# Patient Record
Sex: Female | Born: 1965 | Race: White | Hispanic: No | Smoking: Never smoker
Health system: Southern US, Community
[De-identification: ages and names within clinical notes are randomized; demographics above are authoritative.]

## PROBLEM LIST (undated history)

## (undated) DIAGNOSIS — I10 Essential (primary) hypertension: Secondary | ICD-10-CM

---

## 2016-08-27 ENCOUNTER — Inpatient Hospital Stay (HOSPITAL_COMMUNITY)
Admission: EM | Admit: 2016-08-27 | Discharge: 2016-08-30 | DRG: 872 | Disposition: A | Discharge status: Home / Self Care | Payer: Managed Care, Other (non HMO) | Attending: Family Medicine | Admitting: Family Medicine

## 2016-08-27 ENCOUNTER — Emergency Department (HOSPITAL_COMMUNITY): Payer: Managed Care, Other (non HMO)

## 2016-08-27 ENCOUNTER — Encounter (HOSPITAL_COMMUNITY): Payer: Self-pay

## 2016-08-27 DIAGNOSIS — N730 Acute parametritis and pelvic cellulitis: Secondary | ICD-10-CM | POA: Diagnosis present

## 2016-08-27 DIAGNOSIS — A419 Sepsis, unspecified organism: Secondary | ICD-10-CM | POA: Diagnosis present

## 2016-08-27 DIAGNOSIS — I10 Essential (primary) hypertension: Secondary | ICD-10-CM | POA: Diagnosis present

## 2016-08-27 DIAGNOSIS — D259 Leiomyoma of uterus, unspecified: Secondary | ICD-10-CM | POA: Diagnosis present

## 2016-08-27 DIAGNOSIS — D509 Iron deficiency anemia, unspecified: Secondary | ICD-10-CM | POA: Diagnosis present

## 2016-08-27 DIAGNOSIS — E872 Acidosis: Secondary | ICD-10-CM | POA: Diagnosis present

## 2016-08-27 DIAGNOSIS — R42 Dizziness and giddiness: Secondary | ICD-10-CM | POA: Diagnosis present

## 2016-08-27 DIAGNOSIS — Z9851 Tubal ligation status: Secondary | ICD-10-CM | POA: Diagnosis not present

## 2016-08-27 DIAGNOSIS — Z6841 Body Mass Index (BMI) 40.0 and over, adult: Secondary | ICD-10-CM | POA: Diagnosis not present

## 2016-08-27 DIAGNOSIS — M549 Dorsalgia, unspecified: Secondary | ICD-10-CM | POA: Diagnosis present

## 2016-08-27 DIAGNOSIS — N939 Abnormal uterine and vaginal bleeding, unspecified: Secondary | ICD-10-CM | POA: Diagnosis present

## 2016-08-27 HISTORY — DX: Essential (primary) hypertension: I10

## 2016-08-27 LAB — CBC WITH DIFFERENTIAL/PLATELET
BASOS ABS: 0 10*3/uL (ref 0.0–0.1)
BASOS PCT: 0 %
Basophils Absolute: 0 10*3/uL (ref 0.0–0.1)
Basophils Relative: 0 %
EOS ABS: 0 10*3/uL (ref 0.0–0.7)
EOS PCT: 0 %
EOS PCT: 0 %
Eosinophils Absolute: 0 10*3/uL (ref 0.0–0.7)
HEMATOCRIT: 30.1 % — AB (ref 36.0–46.0)
HEMATOCRIT: 27 % — AB (ref 36.0–46.0)
HEMOGLOBIN: 8.7 g/dL — AB (ref 12.0–15.0)
Hemoglobin: 7.6 g/dL — ABNORMAL LOW (ref 12.0–15.0)
LYMPHS PCT: 7 %
Lymphocytes Relative: 4 %
Lymphs Abs: 0.8 10*3/uL (ref 0.7–4.0)
Lymphs Abs: 0.7 10*3/uL (ref 0.7–4.0)
MCH: 18.5 pg — ABNORMAL LOW (ref 26.0–34.0)
MCH: 18 pg — AB (ref 26.0–34.0)
MCHC: 28.9 g/dL — ABNORMAL LOW (ref 30.0–36.0)
MCHC: 28.1 g/dL — ABNORMAL LOW (ref 30.0–36.0)
MCV: 64 fL — AB (ref 78.0–100.0)
MCV: 64 fL — AB (ref 78.0–100.0)
MONO ABS: 0.9 10*3/uL (ref 0.1–1.0)
MONOS PCT: 3 %
Monocytes Absolute: 0.3 10*3/uL (ref 0.1–1.0)
Monocytes Relative: 5 %
NEUTROS ABS: 16.9 10*3/uL — AB (ref 1.7–7.7)
NEUTROS PCT: 90 %
NEUTROS PCT: 91 %
Neutro Abs: 9.8 10*3/uL — ABNORMAL HIGH (ref 1.7–7.7)
PLATELETS: 234 10*3/uL (ref 150–400)
Platelets: 257 10*3/uL (ref 150–400)
RBC: 4.7 MIL/uL (ref 3.87–5.11)
RBC: 4.22 MIL/uL (ref 3.87–5.11)
RDW: 18.1 % — ABNORMAL HIGH (ref 11.5–15.5)
RDW: 18.3 % — AB (ref 11.5–15.5)
WBC: 10.9 10*3/uL — AB (ref 4.0–10.5)
WBC: 18.5 10*3/uL — ABNORMAL HIGH (ref 4.0–10.5)

## 2016-08-27 LAB — I-STAT CG4 LACTIC ACID, ED
Lactic Acid, Venous: 2.31 mmol/L (ref 0.5–1.9)
Lactic Acid, Venous: 1.74 mmol/L (ref 0.5–1.9)

## 2016-08-27 LAB — COMPREHENSIVE METABOLIC PANEL
ALBUMIN: 3.4 g/dL — AB (ref 3.5–5.0)
ALT: 21 U/L (ref 14–54)
AST: 18 U/L (ref 15–41)
Alkaline Phosphatase: 65 U/L (ref 38–126)
Anion gap: 7 (ref 5–15)
BUN: 10 mg/dL (ref 6–20)
CHLORIDE: 107 mmol/L (ref 101–111)
CO2: 25 mmol/L (ref 22–32)
CREATININE: 0.66 mg/dL (ref 0.44–1.00)
Calcium: 9 mg/dL (ref 8.9–10.3)
GFR calc Af Amer: >60 mL/min (ref >60)
GLUCOSE: 152 mg/dL — AB (ref 65–99)
Potassium: 3.5 mmol/L (ref 3.5–5.1)
Sodium: 139 mmol/L (ref 135–145)
Total Bilirubin: 0.4 mg/dL (ref 0.3–1.2)
Total Protein: 6.9 g/dL (ref 6.5–8.1)

## 2016-08-27 LAB — URINALYSIS, ROUTINE W REFLEX MICROSCOPIC
Bilirubin Urine: NEGATIVE
GLUCOSE, UA: NEGATIVE mg/dL
KETONES UR: NEGATIVE mg/dL
LEUKOCYTES UA: NEGATIVE
NITRITE: NEGATIVE
PH: 5.5 (ref 5.0–8.0)
Protein, ur: NEGATIVE mg/dL
Specific Gravity, Urine: 1.03 (ref 1.005–1.030)

## 2016-08-27 LAB — URINE MICROSCOPIC-ADD ON: WBC, UA: NONE SEEN WBC/hpf (ref 0–5)

## 2016-08-27 LAB — I-STAT BETA HCG BLOOD, ED (MC, WL, AP ONLY)

## 2016-08-27 LAB — ABO/RH: ABO/RH(D): A POS

## 2016-08-27 LAB — WET PREP, GENITAL
CLUE CELLS WET PREP: NONE SEEN
Sperm: NONE SEEN
TRICH WET PREP: NONE SEEN
Yeast Wet Prep HPF POC: NONE SEEN

## 2016-08-27 MED ORDER — DOXYCYCLINE HYCLATE 100 MG IV SOLR
100.0000 mg | Freq: Two times a day (BID) | INTRAVENOUS | Status: DC
  Administered 2016-08-28 – 2016-08-30 (×5): 100 mg via INTRAVENOUS
  Filled 2016-08-27 (×6): qty 100

## 2016-08-27 MED ORDER — ONDANSETRON HCL 4 MG/2ML IJ SOLN
4.0000 mg | Freq: Once | INTRAMUSCULAR | Status: AC
  Administered 2016-08-27: 4 mg via INTRAVENOUS
  Filled 2016-08-27: qty 2

## 2016-08-27 MED ORDER — MEGESTROL ACETATE 40 MG PO TABS
80.0000 mg | ORAL_TABLET | Freq: Two times a day (BID) | ORAL | Status: DC
  Administered 2016-08-28 – 2016-08-30 (×5): 80 mg via ORAL
  Filled 2016-08-27 (×5): qty 2

## 2016-08-27 MED ORDER — DEXTROSE 5 % IV SOLN
2.0000 g | Freq: Four times a day (QID) | INTRAVENOUS | Status: DC
  Administered 2016-08-28 (×3): 2 g via INTRAVENOUS
  Filled 2016-08-27 (×7): qty 2

## 2016-08-27 MED ORDER — SODIUM CHLORIDE 0.9 % IV SOLN
INTRAVENOUS | Status: AC
  Administered 2016-08-28: via INTRAVENOUS

## 2016-08-27 MED ORDER — DEXTROSE 5 % IV SOLN
2.0000 g | Freq: Once | INTRAVENOUS | Status: AC
  Administered 2016-08-27: 2 g via INTRAVENOUS
  Filled 2016-08-27: qty 2

## 2016-08-27 MED ORDER — SODIUM CHLORIDE 0.9 % IV SOLN
Freq: Once | INTRAVENOUS | Status: AC
  Administered 2016-08-28: via INTRAVENOUS

## 2016-08-27 MED ORDER — ACETAMINOPHEN 500 MG PO TABS
1000.0000 mg | ORAL_TABLET | Freq: Four times a day (QID) | ORAL | Status: DC | PRN
  Administered 2016-08-28: 1000 mg via ORAL
  Filled 2016-08-27: qty 2

## 2016-08-27 MED ORDER — HYDROMORPHONE HCL 1 MG/ML IJ SOLN
1.0000 mg | Freq: Once | INTRAMUSCULAR | Status: AC
  Administered 2016-08-27: 1 mg via INTRAVENOUS
  Filled 2016-08-27: qty 1

## 2016-08-27 MED ORDER — ONDANSETRON HCL 4 MG/2ML IJ SOLN
4.0000 mg | Freq: Four times a day (QID) | INTRAMUSCULAR | Status: DC | PRN
  Administered 2016-08-28: 4 mg via INTRAVENOUS
  Filled 2016-08-27: qty 2

## 2016-08-27 MED ORDER — SODIUM CHLORIDE 0.9 % IV BOLUS (SEPSIS)
1000.0000 mL | Freq: Once | INTRAVENOUS | Status: AC
  Administered 2016-08-27: 1000 mL via INTRAVENOUS

## 2016-08-27 MED ORDER — MEGESTROL ACETATE 40 MG PO TABS
80.0000 mg | ORAL_TABLET | Freq: Once | ORAL | Status: AC
  Administered 2016-08-27: 80 mg via ORAL
  Filled 2016-08-27: qty 2

## 2016-08-27 MED ORDER — IOPAMIDOL (ISOVUE-300) INJECTION 61%
INTRAVENOUS | Status: AC
  Administered 2016-08-27: 100 mL
  Filled 2016-08-27: qty 100

## 2016-08-27 MED ORDER — HYDROCODONE-ACETAMINOPHEN 5-325 MG PO TABS
1.0000 | ORAL_TABLET | ORAL | Status: DC | PRN
  Administered 2016-08-28: 2 via ORAL
  Administered 2016-08-28: 1 via ORAL
  Administered 2016-08-28 – 2016-08-30 (×5): 2 via ORAL
  Filled 2016-08-27 (×2): qty 2
  Filled 2016-08-27: qty 1
  Filled 2016-08-27 (×4): qty 2

## 2016-08-27 MED ORDER — ACETAMINOPHEN 325 MG PO TABS
650.0000 mg | ORAL_TABLET | Freq: Once | ORAL | Status: AC
  Administered 2016-08-27: 650 mg via ORAL

## 2016-08-27 MED ORDER — ACETAMINOPHEN 325 MG PO TABS
ORAL_TABLET | ORAL | Status: AC
  Filled 2016-08-27: qty 2

## 2016-08-27 MED ORDER — ONDANSETRON HCL 4 MG/2ML IJ SOLN: 4.0000 mg | Freq: Once | INTRAMUSCULAR | Status: DC

## 2016-08-27 MED ORDER — DOXYCYCLINE HYCLATE 100 MG IV SOLR
100.0000 mg | Freq: Once | INTRAVENOUS | Status: AC
  Administered 2016-08-28: 100 mg via INTRAVENOUS
  Filled 2016-08-27: qty 100

## 2016-08-27 NOTE — ED Provider Notes (Signed)
[facility] DEPT Provider Note   CSN: [id number] Arrival date & time: 08/27/16  1527   History   Chief Complaint Chief Complaint  Patient presents with  . Vaginal Bleeding  . Fever    HPI Sharon Manning is a 50 y.o. female.  The history is provided by the patient and a relative.   50 year old female with history of hypertension presenting with abdominal pain and vaginal bleeding. Onset about 5 days ago. Worsening since then. Now severe. Vaginal bleeding described as thick clots, now turning bright red. She states she has been soaking through a large tampon or pad at a rate of about 1 an hour. She became concerned because today she started feeling lightheaded and nauseous with standing. States that her last several periods have been more closely spaced, occurring every month, with heavy bleeding and passage of clot associated with this. Previously she had started having cycles every 3-4 months. She does not have a known history of fibroids, but is concerned because she has a strong family history of fibroids. Has not seen an OB/GYN in over 8 years. Prior to starting this menstrual cycle she has not had vaginal pain or discharge and has no concerns for STIs. Has hx of vaginal deliveries and tubal ligation.   Abdominal pain located in bilateral pelvic area, right-sided more than left. Radiating around right flank. Described as pressure and sore. Worse with standing or palpation. Severe. Worsening since onset, and today the pain was worse than prior days. Associated fever, nausea. No urinary symptoms, and patient denies diarrhea or constipation. Last bowel movement today was normal.   Past Medical History:  Diagnosis Date  . Hypertension     Patient Active Problem List   Diagnosis Date Noted  . PID (acute pelvic inflammatory disease) 08/27/2016  . Sepsis, unspecified organism ([place]) 08/27/2016  . Abnormal uterine bleeding (AUB) 08/27/2016  . Microcytic anemia 08/27/2016  . Sepsis  ([place]) 08/27/2016    History reviewed. No pertinent surgical history.  OB History    No data available       Home Medications    Prior to Admission medications   Medication Sig Start Date End Date Taking? Authorizing Provider  ibuprofen (ADVIL) 200 MG tablet Take 800 mg by mouth 2 (two) times daily as needed for moderate pain.   Yes Historical Provider, MD  OVER THE COUNTER MEDICATION Take 1 tablet by mouth at bedtime as needed (for restless leg syndrome). Legatrin   Yes Historical Provider, MD    Family History Family History  Problem Relation Age of Onset  . Uterine cancer Neg Hx     Social History Social History  Substance Use Topics  . Smoking status: Never Smoker  . Smokeless tobacco: Never Used  . Alcohol use Not on file     Allergies   Review of patient's allergies indicates no known allergies.   Review of Systems Review of Systems  Constitutional: Positive for fatigue and fever.  Respiratory: Negative for cough and shortness of breath.   Cardiovascular: Negative for chest pain.  Gastrointestinal: Positive for abdominal pain and nausea. Negative for blood in stool, constipation, diarrhea and vomiting.  Genitourinary: Positive for menstrual problem, pelvic pain and vaginal bleeding. Negative for dysuria and hematuria.  Skin: Positive for pallor.  Neurological: Positive for light-headedness.  Hematological: Does not bruise/bleed easily.  All other systems reviewed and are negative.   Physical Exam Updated Vital Signs BP 121/75   Pulse 89   Temp (S) 101.2 F (38.4 C) (  Oral)   Resp 17   LMP 08/22/2016   SpO2 99%   Physical Exam  Constitutional: She appears well-developed and well-nourished. No distress.  HENT:  Head: Normocephalic and atraumatic.  Eyes: Conjunctivae are normal.  Neck: Neck supple.  Cardiovascular: Normal rate and regular rhythm.   No murmur heard. Pulmonary/Chest: Effort normal and breath sounds normal. No respiratory distress.    Abdominal: Soft. There is tenderness (TTP RLQ, suprapubic area, as well as in epigastric, no guarding or rebound).  Genitourinary:  Genitourinary Comments: CMT. Enlarged, tender uterus. bilat adnexal tenderness. Bloody mucous trickling from os. External os with adherent irregular shaped, nontender mass.   Musculoskeletal: She exhibits no edema.  Neurological: She is alert.  Skin: Skin is warm and dry. There is pallor.  Psychiatric: She has a normal mood and affect.  Nursing note and vitals reviewed.   ED Treatments / Results  Labs (all labs ordered are listed, but only abnormal results are displayed) Labs Reviewed  WET PREP, GENITAL - Abnormal; Notable for the following:       Result Value   WBC, Wet Prep HPF POC MANY (*)    All other components within normal limits  COMPREHENSIVE METABOLIC PANEL - Abnormal; Notable for the following:    Glucose, Bld 152 (*)    Albumin 3.4 (*)    All other components within normal limits  CBC WITH DIFFERENTIAL/PLATELET - Abnormal; Notable for the following:    WBC 10.9 (*)    Hemoglobin 8.7 (*)    HCT 30.1 (*)    MCV 64.0 (*)    MCH 18.5 (*)    MCHC 28.9 (*)    RDW 18.1 (*)    Neutro Abs 9.8 (*)    All other components within normal limits  URINALYSIS, ROUTINE W REFLEX MICROSCOPIC (NOT AT [facility]) - Abnormal; Notable for the following:    Hgb urine dipstick MODERATE (*)    All other components within normal limits  URINE MICROSCOPIC-ADD ON - Abnormal; Notable for the following:    Squamous Epithelial / LPF 0-5 (*)    Bacteria, UA RARE (*)    All other components within normal limits  CBC WITH DIFFERENTIAL/PLATELET - Abnormal; Notable for the following:    WBC 18.5 (*)    Hemoglobin 7.6 (*)    HCT 27.0 (*)    MCV 64.0 (*)    MCH 18.0 (*)    MCHC 28.1 (*)    RDW 18.3 (*)    Neutro Abs 16.9 (*)    All other components within normal limits  I-STAT CG4 LACTIC ACID, ED - Abnormal; Notable for the following:    Lactic Acid, Venous 2.31 (*)     All other components within normal limits  CULTURE, BLOOD (ROUTINE X 2)  CULTURE, BLOOD (ROUTINE X 2)  URINE CULTURE  LACTIC ACID, PLASMA  LACTIC ACID, PLASMA  PROCALCITONIN  PROTIME-INR  APTT  FERRITIN  IRON AND TIBC  VITAMIN B12  FOLATE RBC  HEMOGLOBIN AND HEMATOCRIT, BLOOD  HEMOGLOBIN AND HEMATOCRIT, BLOOD  HEMOGLOBIN AND HEMATOCRIT, BLOOD  I-STAT BETA HCG BLOOD, ED ([facility], [facility], [facility] ONLY)  I-STAT CG4 LACTIC ACID, ED  I-STAT CG4 LACTIC ACID, ED  TYPE AND SCREEN  ABO/RH  PREPARE RBC (CROSSMATCH)  GC/CHLAMYDIA PROBE AMP ([place]) NOT AT [facility]    EKG  EKG Interpretation None       Radiology Dg Chest 2 View  Result Date: 08/27/2016 CLINICAL DATA:  Patient with heavy menstrual bleeding. Dizziness. Syncope. EXAM:  CHEST  2 VIEW COMPARISON:  None. FINDINGS: The heart size and mediastinal contours are within normal limits. Both lungs are clear. The visualized skeletal structures are unremarkable. IMPRESSION: No active cardiopulmonary disease. Electronically Signed   By: Lovey Newcomer M.D.   On: 08/27/2016 16:30   Ct Abdomen Pelvis W Contrast  Result Date: 08/27/2016 CLINICAL DATA:  Abdominal and back pain for 1 week with heavy vaginal bleeding with clots, chills and weakness today, pale arrival, severe abdominal pain primarily RIGHT lower quadrant and suprapubic becoming more generalized now EXAM: CT ABDOMEN AND PELVIS WITH CONTRAST TECHNIQUE: Multidetector CT imaging of the abdomen and pelvis was performed using the standard protocol following bolus administration of intravenous contrast. Sagittal and coronal MPR images reconstructed from axial data set. CONTRAST:  158mL ISOVUE-300 IOPAMIDOL (ISOVUE-300) INJECTION 61% IV. Dilute oral contrast. COMPARISON:  None FINDINGS: Lower chest: Dependent atelectasis at lung bases. Hepatobiliary: Gallbladder surgically absent.  Liver unremarkable. Pancreas: Normal appearance Spleen: Nonspecific low-attenuation focus at superior aspect of spleen  anteriorly 14 x 11 mm image 16. Adrenals/Urinary Tract: Adrenal glands normal appearance. BILATERAL renal cysts. Kidneys, ureters, and bladder otherwise normal appearance. Stomach/Bowel: Normal appendix. Stomach contains food debris, otherwise normal appearance. Scattered stool throughout colon without bowel dilatation. Infiltrative changes identified surrounding small bowel loops in the mid abdomen into the upper RIGHT pelvis though none of the small bowel loos demonstrate wall thickening or dilatation. Vascular/Lymphatic: Vascular structures patent.  No adenopathy. Reproductive: Uterus prominent in size at 11.3 x 6.2 x 8.2 cm. Exophytic leiomyoma posterior RIGHT uterus cross fit 3 cm diameter. Additional tiny low-attenuation focus 11 mm diameter within RIGHT uterus could represent small cyst or additional leiomyoma. BILATERAL fallopian tube enlargement and mild dilatation with wall thickening of the RIGHT tube versus LEFT. Findings are suspicious for BILATERAL salpingitis PID. Small cyst within RIGHT ovary 19 x 17 mm. Free fluid adjacent to uterus as well as in the upper RIGHT pelvis into the mid abdomen with associated mild omental edema. Other: No free air. Tiny umbilical hernia. Question nabothian cysts at lower uterine segment. Musculoskeletal: No acute osseous findings. IMPRESSION: Enlarged uterus with a 3 cm leiomyoma. BILATERAL fallopian tube enlargement and dilatation with wall thickening particularly on RIGHT associated with edema and infiltrative changes in primarily in the RIGHT pelvis, with less edema/fluid adjacent to uterus, raising question of BILATERAL salpingitis/PID; correlation with physical exam for cervical motion tenderness recommended. Small RIGHT ovarian cyst. Small umbilical hernia. Nonspecific 14 x 11 mm lesion within spleen. Electronically Signed   By: Lavonia Dana M.D.   On: 08/27/2016 19:13    Procedures Procedures (including critical care time)  Medications Ordered in  ED Medications  ondansetron (ZOFRAN) injection 4 mg (4 mg Intravenous Refused 08/27/16 2131)  doxycycline (VIBRAMYCIN) 100 mg in dextrose 5 % 250 mL IVPB (100 mg Intravenous New Bag/Given 08/28/16 0005)  cefOXitin (MEFOXIN) 2 g in dextrose 5 % 50 mL IVPB (not administered)  doxycycline (VIBRAMYCIN) 100 mg in dextrose 5 % 250 mL IVPB (not administered)  acetaminophen (TYLENOL) tablet 1,000 mg (not administered)  HYDROcodone-acetaminophen (NORCO/VICODIN) 5-325 MG per tablet 1-2 tablet (1 tablet Oral Given 08/28/16 0003)  ondansetron (ZOFRAN) injection 4 mg (not administered)  megestrol (MEGACE) tablet 80 mg (not administered)  0.9 %  sodium chloride infusion ( Intravenous New Bag/Given 08/28/16 0006)  acetaminophen (TYLENOL) tablet 650 mg (650 mg Oral Given 08/27/16 1547)  sodium chloride 0.9 % bolus 1,000 mL (0 mLs Intravenous Stopped 08/27/16 2005)  HYDROmorphone (DILAUDID) injection 1  mg (1 mg Intravenous Given 08/27/16 1708)  ondansetron (ZOFRAN) injection 4 mg (4 mg Intravenous Given 08/27/16 1707)  iopamidol (ISOVUE-300) 61 % injection (100 mLs  Contrast Given 08/27/16 1833)  HYDROmorphone (DILAUDID) injection 1 mg (1 mg Intravenous Given 08/27/16 1952)  cefOXitin (MEFOXIN) 2 g in dextrose 5 % 50 mL IVPB (0 g Intravenous Stopped 08/28/16 0000)  megestrol (MEGACE) tablet 80 mg (80 mg Oral Given 08/27/16 2358)  0.9 %  sodium chloride infusion ( Intravenous New Bag/Given 08/28/16 0006)     Initial Impression / Assessment and Plan / ED Course  I have reviewed the triage vital signs and the nursing notes.  Pertinent labs & imaging results that were available during my care of the patient were reviewed by me and considered in my medical decision making (see chart for details).  Clinical Course    50 year old female with history of hypertension presenting with abdominal pain and vaginal bleeding, as above. She is febrile and tachycardic, with a lactic acidosis and leukocytosis, concerning for sepsis.  Pelvic exam with significant cervical motion tenderness, cervical mass, enlarged uterus, bilateral adnexal tenderness. She also has significant tenderness on her abdominal exam as above. In setting of this, CT abdomen and pelvis obtained to evaluate further- notable for bilat salpingitis without evidence of TOA, consistent with overall clinical picture of PID. Cultures drawn and patient started on cefoxitin and doxycycline.  Vaginal bleeding likely secondary to fibroid seen on CT scan. Hemoglobin here initially 8.7, on repeat down to 7.6.  Discussed with OB/GYN on call. They will continue to follow in consultation as needed. Advised starting 80 megace BID until bleeding stops, then to discharge patient on 40/day until she is seen in clinic to follow up. First dose of this ordered here.   Admitted to hospitalist for further care.   Case discussed with Dr. Jeneen Rinks who oversaw management of this patient.    Final Clinical Impressions(s) / ED Diagnoses   Final diagnoses:  Vaginal bleeding  Uterine leiomyoma, unspecified location  PID (acute pelvic inflammatory disease)  Sepsis, due to unspecified organism [facility])    New Prescriptions New Prescriptions   No medications on file     Ivin Booty, MD 08/28/16 0104    Tanna Furry, MD 08/30/16 1506

## 2016-08-27 NOTE — ED Notes (Signed)
Pt transported to CT ?

## 2016-08-27 NOTE — ED Notes (Signed)
Awaiting medications from pharmacy.

## 2016-08-27 NOTE — ED Triage Notes (Signed)
Patient complains of abdominal and back pain x 1 week with heavy vaginal bleeding with clots. Today chills and weakness with same. Patient pale on arrival. Alert and oriented. No vomiting, no diarrhea, no cough

## 2016-08-27 NOTE — H&P (Signed)
History and Physical    Sharon Manning [id number] DOB: 12-Aug-1966 DOA: 08/27/2016  PCP: No PCP Per Patient   Patient coming from: Home   Chief Complaint: heavy vaginal bleeding, abdominal and back pain, chills   HPI: Sharon Manning is a 50 y.o. female with medical history significant for hypertension who presents the emergency department for evaluation of 1 week of progressive abdominal pain and heavy vaginal bleeding since 08/22/2016. Patient reports that she continues to have fairly regular periods, and reports that the first 3 days are usually quite heavy. She began bleeding heavily on 08/22/2016, which was the expected time for her period, but the bleeding has persisted since that time and she has required up to 1 pad per hour. Over this same interval, she has had abdominal pain which is now severe and generalized. There is associated pain in the mid to low back as well. She denies dysuria, but endorses chills. She denies any new sexual partners and denies any history of STI. She reports that she has not seen a physician in several years due to lack of insurance. She denies any chest pain, palpitations, headache, change in vision or hearing, or focal numbness or weakness. There is been no diarrhea, melena, or hematochezia. She has not noted any gross hematuria.  ED Course: Upon arrival to the ED, patient is found to be febrile to 39.2 C, saturating well on room air, tachycardic in the 110s, and with vitals otherwise stable. CMP is largely unremarkable and CBC is notable for a leukocytosis to 10,900 and hemoglobin of 8.7 with MCV of 64.0 and unknown baseline hemoglobin. Lactic acid is elevated to a value of 2.31. Urinalysis features moderate hemoglobin is otherwise unremarkable. Chest x-ray is negative for acute cardiopulmonary disease. CT of the abdomen and pelvis was obtained and demonstrates an enlarged uterus with a 3 cm leiomyoma and bilateral fallopian tube enlargement, dilation, wall  thickening, and associated edema and infiltrative changes concerning for bilateral salpingitis/PID. Bimanual pelvic exam was performed by the ED physician and notable for marked cervical motion tenderness. Patient was given a 1 L bolus of normal saline, type and screen was performed, blood and urine cultures were obtained, and she was started on empiric treatment with cefoxitin and doxycycline. ED physician discussed the case with the on-call gynecologist who advised treating with Megace 80 mg twice a day for the bleeding and agreed with antibiotics for PID. Tachycardia resolved following the fluid bolus, the patient has remained hemodynamically stable, and she will be admitted to the telemetry unit for ongoing evaluation and management of sepsis secondary to PID and microcytic anemia with unknown baseline, and abnormal uterine bleeding.  Review of Systems:  All other systems reviewed and apart from HPI, are negative.  Past Medical History:  Diagnosis Date  . Hypertension     History reviewed. No pertinent surgical history.   reports that she has never smoked. She has never used smokeless tobacco. Her alcohol and drug histories are not on file.  No Known Allergies  Family History  Problem Relation Age of Onset  . Uterine cancer Neg Hx      Prior to Admission medications   Medication Sig Start Date End Date Taking? Authorizing Provider  ibuprofen (ADVIL) 200 MG tablet Take 800 mg by mouth 2 (two) times daily as needed for moderate pain.   Yes Historical Provider, MD  OVER THE COUNTER MEDICATION Take 1 tablet by mouth at bedtime as needed (for restless leg syndrome). Legatrin   Yes Historical  Provider, MD    Physical Exam: Vitals:   08/27/16 2130 08/27/16 2145 08/27/16 2200 08/27/16 2215  BP: 114/65 121/66 132/72 130/62  Pulse: 102 (!) 50 100 98  Resp: 17 18 20 16   Temp:      TempSrc:      SpO2: 96% (!) 85% 99% 98%      Constitutional: NAD, calm, comfortable Eyes: PERTLA,  lids, pale conjunctivae ENMT: Mucous membranes are moist. Posterior pharynx clear of any exudate or lesions.   Neck: normal, supple, no masses, no thyromegaly Respiratory: clear to auscultation bilaterally, no wheezing, no crackles. Normal respiratory effort.   Cardiovascular: S1 & S2 heard, regular rate and rhythm, no significant murmur. No extremity edema. 2+ pedal pulses.  Abdomen: No distension, tender throughout, no rebound pain or guarding , no masses palpated. Bowel sounds normal.  Gyn: performed by EDP, see their note for reports of CMT and enlarged uterus Musculoskeletal: no clubbing / cyanosis. No joint deformity upper and lower extremities. Normal muscle tone.  Skin: no significant rashes, lesions, ulcers. Warm, dry, well-perfused. Neurologic: CN 2-12 grossly intact. Sensation intact, DTR normal. Strength 5/5 in all 4 limbs.  Psychiatric: Normal judgment and insight. Alert and oriented x 3. Normal mood and affect.     Labs on Admission: I have personally reviewed following labs and imaging studies  CBC:  Recent Labs Lab 08/27/16 1545 08/27/16 2302  WBC 10.9* 18.5*  NEUTROABS 9.8* PENDING  HGB 8.7* 7.6*  HCT 30.1* 27.0*  MCV 64.0* 64.0*  PLT 257 [id number]   Basic Metabolic Panel:  Recent Labs Lab 08/27/16 1545  NA 139  K 3.5  CL 107  CO2 25  GLUCOSE 152*  BUN 10  CREATININE 0.66  CALCIUM 9.0   GFR: CrCl cannot be calculated (Unknown ideal weight.). Liver Function Tests:  Recent Labs Lab 08/27/16 1545  AST 18  ALT 21  ALKPHOS 65  BILITOT 0.4  PROT 6.9  ALBUMIN 3.4*   No results for input(s): LIPASE, AMYLASE in the last 168 hours. No results for input(s): AMMONIA in the last 168 hours. Coagulation Profile: No results for input(s): INR, PROTIME in the last 168 hours. Cardiac Enzymes: No results for input(s): CKTOTAL, CKMB, CKMBINDEX, TROPONINI in the last 168 hours. BNP (last 3 results) No results for input(s): PROBNP in the last 8760 hours. HbA1C: No  results for input(s): HGBA1C in the last 72 hours. CBG: No results for input(s): GLUCAP in the last 168 hours. Lipid Profile: No results for input(s): CHOL, HDL, LDLCALC, TRIG, CHOLHDL, LDLDIRECT in the last 72 hours. Thyroid Function Tests: No results for input(s): TSH, T4TOTAL, FREET4, T3FREE, THYROIDAB in the last 72 hours. Anemia Panel: No results for input(s): VITAMINB12, FOLATE, FERRITIN, TIBC, IRON, RETICCTPCT in the last 72 hours. Urine analysis:    Component Value Date/Time   COLORURINE YELLOW 08/27/2016 2052   APPEARANCEUR CLEAR 08/27/2016 2052   LABSPEC 1.030 08/27/2016 2052   PHURINE 5.5 08/27/2016 2052   GLUCOSEU NEGATIVE 08/27/2016 2052   HGBUR MODERATE (A) 08/27/2016 2052   BILIRUBINUR NEGATIVE 08/27/2016 2052   [place] NEGATIVE 08/27/2016 2052   PROTEINUR NEGATIVE 08/27/2016 2052   NITRITE NEGATIVE 08/27/2016 2052   LEUKOCYTESUR NEGATIVE 08/27/2016 2052   Sepsis Labs: @LABRCNTIP (procalcitonin:4,lacticidven:4) ) Recent Results (from the past 240 hour(s))  Wet prep, genital     Status: Abnormal   Collection Time: 08/27/16  6:10 PM  Result Value Ref Range Status   Yeast Wet Prep HPF POC NONE SEEN NONE SEEN Final  Trich, Wet Prep NONE SEEN NONE SEEN Final   Clue Cells Wet Prep HPF POC NONE SEEN NONE SEEN Final   WBC, Wet Prep HPF POC MANY (A) NONE SEEN Final   Sperm NONE SEEN  Final     Radiological Exams on Admission: Dg Chest 2 View  Result Date: 08/27/2016 CLINICAL DATA:  Patient with heavy menstrual bleeding. Dizziness. Syncope. EXAM: CHEST  2 VIEW COMPARISON:  None. FINDINGS: The heart size and mediastinal contours are within normal limits. Both lungs are clear. The visualized skeletal structures are unremarkable. IMPRESSION: No active cardiopulmonary disease. Electronically Signed   By: Lovey Newcomer M.D.   On: 08/27/2016 16:30   Ct Abdomen Pelvis W Contrast  Result Date: 08/27/2016 CLINICAL DATA:  Abdominal and back pain for 1 week with heavy vaginal  bleeding with clots, chills and weakness today, pale arrival, severe abdominal pain primarily RIGHT lower quadrant and suprapubic becoming more generalized now EXAM: CT ABDOMEN AND PELVIS WITH CONTRAST TECHNIQUE: Multidetector CT imaging of the abdomen and pelvis was performed using the standard protocol following bolus administration of intravenous contrast. Sagittal and coronal MPR images reconstructed from axial data set. CONTRAST:  119mL ISOVUE-300 IOPAMIDOL (ISOVUE-300) INJECTION 61% IV. Dilute oral contrast. COMPARISON:  None FINDINGS: Lower chest: Dependent atelectasis at lung bases. Hepatobiliary: Gallbladder surgically absent.  Liver unremarkable. Pancreas: Normal appearance Spleen: Nonspecific low-attenuation focus at superior aspect of spleen anteriorly 14 x 11 mm image 16. Adrenals/Urinary Tract: Adrenal glands normal appearance. BILATERAL renal cysts. Kidneys, ureters, and bladder otherwise normal appearance. Stomach/Bowel: Normal appendix. Stomach contains food debris, otherwise normal appearance. Scattered stool throughout colon without bowel dilatation. Infiltrative changes identified surrounding small bowel loops in the mid abdomen into the upper RIGHT pelvis though none of the small bowel loos demonstrate wall thickening or dilatation. Vascular/Lymphatic: Vascular structures patent.  No adenopathy. Reproductive: Uterus prominent in size at 11.3 x 6.2 x 8.2 cm. Exophytic leiomyoma posterior RIGHT uterus cross fit 3 cm diameter. Additional tiny low-attenuation focus 11 mm diameter within RIGHT uterus could represent small cyst or additional leiomyoma. BILATERAL fallopian tube enlargement and mild dilatation with wall thickening of the RIGHT tube versus LEFT. Findings are suspicious for BILATERAL salpingitis PID. Small cyst within RIGHT ovary 19 x 17 mm. Free fluid adjacent to uterus as well as in the upper RIGHT pelvis into the mid abdomen with associated mild omental edema. Other: No free air. Tiny  umbilical hernia. Question nabothian cysts at lower uterine segment. Musculoskeletal: No acute osseous findings. IMPRESSION: Enlarged uterus with a 3 cm leiomyoma. BILATERAL fallopian tube enlargement and dilatation with wall thickening particularly on RIGHT associated with edema and infiltrative changes in primarily in the RIGHT pelvis, with less edema/fluid adjacent to uterus, raising question of BILATERAL salpingitis/PID; correlation with physical exam for cervical motion tenderness recommended. Small RIGHT ovarian cyst. Small umbilical hernia. Nonspecific 14 x 11 mm lesion within spleen. Electronically Signed   By: Lavonia Dana M.D.   On: 08/27/2016 19:13    EKG: Not performed, will obtain as appropriate.  Assessment/Plan  1. Sepsis secondary to PID  - Pt meets sepsis criteria on admission  - Blood and urine cultures incubating; GC/chlammydia ordered  - Lactate 2.31 initially, will trend  - CXR clear, UA not suggestive of infection; CT and exam findings consistent with PID  - Empiric treatment initiated with cefoxitin and doxycycline, will continue  - Given NS bolus in ED, will continue IVF with NS   2. Abnormal uterine bleeding with microcytic anemia  -  Pt reports hx of heavy periods, but currently experiencing bleeding beyond any prior period  - Initial Hgb is 8.7 with unknown baseline; likely chronically low given MCV 64.0  - A repeat Hgb returned at 7.6 and 1 unit of blood was ordered for immediate transfusion with a second placed on hold  - ED physician discussed case with on-call OB/GYN and Megace 80 mg BID was advised until bleeding stops, then 40 mg BID until she gets in to see them  - RN asked to place order for post-transfusion H&H  - Check iron studies, B12, folate, INR prior to transfusion     DVT prophylaxis: SCD's  Code Status: Full  Family Communication: Discussed with patient Disposition Plan: Admit to telemetry Consults called: None Admission status: Inpatient     Vianne Bulls, MD [facility] Pager [phone]  If 7PM-7AM, please contact night-coverage [email or web address] Password [facility]  08/27/2016, 11:32 PM

## 2016-08-28 LAB — PROCALCITONIN: PROCALCITONIN: 8.09 ng/mL

## 2016-08-28 LAB — POCT I-STAT, CHEM 8
BUN: 12 mg/dL (ref 6–20)
CALCIUM ION: 1.16 mmol/L (ref 1.15–1.40)
Chloride: 103 mmol/L (ref 101–111)
Creatinine, Ser: 0.5 mg/dL (ref 0.44–1.00)
Glucose, Bld: 177 mg/dL — ABNORMAL HIGH (ref 65–99)
HCT: 26 % — ABNORMAL LOW (ref 36.0–46.0)
HEMOGLOBIN: 8.8 g/dL — AB (ref 12.0–15.0)
Potassium: 3.5 mmol/L (ref 3.5–5.1)
SODIUM: 139 mmol/L (ref 135–145)
TCO2: 26 mmol/L (ref 0–100)

## 2016-08-28 LAB — IRON AND TIBC: TIBC: 295 ug/dL (ref 250–450)

## 2016-08-28 LAB — FERRITIN: FERRITIN: 17 ng/mL (ref 11–307)

## 2016-08-28 LAB — HEMOGLOBIN AND HEMATOCRIT, BLOOD
HCT: 25.4 % — ABNORMAL LOW (ref 36.0–46.0)
HEMATOCRIT: 28.2 % — AB (ref 36.0–46.0)
HEMATOCRIT: 27.1 % — AB (ref 36.0–46.0)
HEMOGLOBIN: 7.9 g/dL — AB (ref 12.0–15.0)
HEMOGLOBIN: 7.5 g/dL — AB (ref 12.0–15.0)
Hemoglobin: 7.1 g/dL — ABNORMAL LOW (ref 12.0–15.0)

## 2016-08-28 LAB — PROTIME-INR
INR: 1.09
PROTHROMBIN TIME: 14.1 s (ref 11.4–15.2)

## 2016-08-28 LAB — APTT: aPTT: 33 seconds (ref 24–36)

## 2016-08-28 LAB — LACTIC ACID, PLASMA
Lactic Acid, Venous: 1.3 mmol/L (ref 0.5–1.9)
Lactic Acid, Venous: 1 mmol/L (ref 0.5–1.9)

## 2016-08-28 LAB — VITAMIN B12: Vitamin B-12: 270 pg/mL (ref 180–914)

## 2016-08-28 LAB — PREPARE RBC (CROSSMATCH)

## 2016-08-28 LAB — GLUCOSE, CAPILLARY: Glucose-Capillary: 112 mg/dL — ABNORMAL HIGH (ref 65–99)

## 2016-08-28 MED ORDER — SODIUM CHLORIDE 0.9 % IV SOLN
INTRAVENOUS | Status: DC
  Administered 2016-08-28 – 2016-08-30 (×3): via INTRAVENOUS

## 2016-08-28 MED ORDER — HYDROMORPHONE HCL 1 MG/ML IJ SOLN
0.5000 mg | INTRAMUSCULAR | Status: DC | PRN
  Administered 2016-08-28 – 2016-08-30 (×3): 0.5 mg via INTRAVENOUS
  Filled 2016-08-28 (×3): qty 1

## 2016-08-28 MED ORDER — DEXTROSE 5 % IV SOLN
2.0000 g | Freq: Four times a day (QID) | INTRAVENOUS | Status: DC
Start: 2016-08-29 — End: 2016-08-30
  Administered 2016-08-29 – 2016-08-30 (×6): 2 g via INTRAVENOUS
  Filled 2016-08-28 (×10): qty 2

## 2016-08-28 NOTE — ED Notes (Signed)
Attempted to call report x 1  

## 2016-08-28 NOTE — Progress Notes (Signed)
New Admission Note:   Arrival Method: Via stretcher from ED Mental Orientation:  A & O x 4 Telemetry: Tele #Box 6E26 Assessment: Completed Skin: Intact IV:RUA PIV Pain: Abdominal pain Tubes:  None Safety Measures: Safety Fall Prevention Plan has been given, discussed and signed Admission: Completed [address] [place] Orientation: Patient has been orientated to the room, unit and staff.  Family:  Husband and daughter at bedside  Orders have been reviewed and implemented. Will continue to monitor the patient. Call light has been placed within reach and bed alarm has been activated.   Earleen Reaper RN- [place], [place] Phone number: [phone]

## 2016-08-28 NOTE — Progress Notes (Signed)
PROGRESS NOTE    Sharon Manning  [id number] DOB: [birth year] DOA: 08/27/2016 PCP: No PCP Per Patient    Brief Narrative: Sharon Manning is a 50 y.o. female with medical history significant for hypertension who presents the emergency department for evaluation of 1 week of progressive abdominal pain and heavy vaginal bleeding since 08/22/2016. She was found to have PID and admitted to the hospital for blood transfusion.    Assessment & Plan:   Principal Problem:   PID (acute pelvic inflammatory disease) Active Problems:   Sepsis, unspecified organism ([place])   Abnormal uterine bleeding (AUB)   Microcytic anemia   Sepsis ([place])    Sepsis from PID: - started on IV antibiotics, bed rest and pain control, IV FLUIDS.    Abnormal uterine bleeding: with microcytic anemia: 1 unit of prbc transfusion ordered.  Repeat hemoglobin is 7.9.  Monitor hemoglobin in am.    DVT prophylaxis: scd's Code Status: full code.  Family Communication:none at bedside.  Disposition Plan: pending pain control .   Consultants:   None.    Procedures: none   Antimicrobials: doxycycline.    Subjective: abd pain improved.   Objective: Vitals:   08/28/16 0325 08/28/16 0400 08/28/16 0550 08/28/16 0930  BP: 123/68 131/64 125/65 138/65  Pulse: 90 89 78 83  Resp: 17 16 16 18   Temp: 98.3 F (36.8 C) 98.2 F (36.8 C) 98.7 F (37.1 C) 97.5 F (36.4 C)  TempSrc: Oral Oral Oral Oral  SpO2: 96% 94% 95% 97%  Weight:      Height:        Intake/Output Summary (Last 24 hours) at 08/28/16 1819 Last data filed at 08/28/16 1000  Gross per 24 hour  Intake             1605 ml  Output                0 ml  Net             1605 ml   Filed Weights   08/28/16 0142  Weight: 106.7 kg (235 lb 3.7 oz)    Examination:  General exam: Appears calm and comfortable  Respiratory system: Clear to auscultation. Respiratory effort normal. Cardiovascular system: S1 & S2 heard, RRR. No JVD, murmurs, rubs,  gallops or clicks. No pedal edema. Gastrointestinal system: Abdomen is nondistended, soft and nontender. No organomegaly or masses felt. Normal bowel sounds heard. Central nervous system: Alert and oriented. No focal neurological deficits. Extremities: Symmetric 5 x 5 power. Skin: No rashes, lesions or ulcers     Data Reviewed: I have personally reviewed following labs and imaging studies  CBC:  Recent Labs Lab 08/27/16 1545 08/27/16 2302 08/27/16 2310 08/28/16 0300 08/28/16 0945  WBC 10.9* 18.5*  --   --   --   NEUTROABS 9.8* 16.9*  --   --   --   HGB 8.7* 7.6* 8.8* 7.1* 7.9*  HCT 30.1* 27.0* 26.0* 25.4* 28.2*  MCV 64.0* 64.0*  --   --   --   PLT 257 234  --   --   --    Basic Metabolic Panel:  Recent Labs Lab 08/27/16 1545 08/27/16 2310  NA 139 139  K 3.5 3.5  CL 107 103  CO2 25  --   GLUCOSE 152* 177*  BUN 10 12  CREATININE 0.66 0.50  CALCIUM 9.0  --    GFR: Estimated Creatinine Clearance: 97.3 mL/min (by C-G formula based on SCr of 0.5 mg/dL). Liver  Function Tests:  Recent Labs Lab 08/27/16 1545  AST 18  ALT 21  ALKPHOS 65  BILITOT 0.4  PROT 6.9  ALBUMIN 3.4*   No results for input(s): LIPASE, AMYLASE in the last 168 hours. No results for input(s): AMMONIA in the last 168 hours. Coagulation Profile:  Recent Labs Lab 08/28/16 0119  INR 1.09   Cardiac Enzymes: No results for input(s): CKTOTAL, CKMB, CKMBINDEX, TROPONINI in the last 168 hours. BNP (last 3 results) No results for input(s): PROBNP in the last 8760 hours. HbA1C: No results for input(s): HGBA1C in the last 72 hours. CBG:  Recent Labs Lab 08/28/16 0811  GLUCAP 112*   Lipid Profile: No results for input(s): CHOL, HDL, LDLCALC, TRIG, CHOLHDL, LDLDIRECT in the last 72 hours. Thyroid Function Tests: No results for input(s): TSH, T4TOTAL, FREET4, T3FREE, THYROIDAB in the last 72 hours. Anemia Panel:  Recent Labs  08/28/16 0119  VITAMINB12 270  FERRITIN 17  TIBC 295    IRON <5*   Sepsis Labs:  Recent Labs Lab 08/27/16 0120 08/27/16 1827 08/27/16 2317 08/28/16 0119 08/28/16 0336  PROCALCITON  --   --   --  8.09  --   LATICACIDVEN 1.3 2.31* 1.74  --  1.0    Recent Results (from the past 240 hour(s))  Blood culture (routine x 2)     Status: None (Preliminary result)   Collection Time: 08/27/16  4:54 PM  Result Value Ref Range Status   Specimen Description BLOOD RIGHT HAND  Final   Special Requests BOTTLES DRAWN AEROBIC AND ANAEROBIC 5 CC  Final   Culture NO GROWTH < 24 HOURS  Final   Report Status PENDING  Incomplete  Blood culture (routine x 2)     Status: None (Preliminary result)   Collection Time: 08/27/16  4:55 PM  Result Value Ref Range Status   Specimen Description BLOOD RIGHT ANTECUBITAL  Final   Special Requests BOTTLES DRAWN AEROBIC AND ANAEROBIC 5CC  Final   Culture NO GROWTH < 24 HOURS  Final   Report Status PENDING  Incomplete  Wet prep, genital     Status: Abnormal   Collection Time: 08/27/16  6:10 PM  Result Value Ref Range Status   Yeast Wet Prep HPF POC NONE SEEN NONE SEEN Final   Trich, Wet Prep NONE SEEN NONE SEEN Final   Clue Cells Wet Prep HPF POC NONE SEEN NONE SEEN Final   WBC, Wet Prep HPF POC MANY (A) NONE SEEN Final   Sperm NONE SEEN  Final         Radiology Studies: Dg Chest 2 View  Result Date: 08/27/2016 CLINICAL DATA:  Patient with heavy menstrual bleeding. Dizziness. Syncope. EXAM: CHEST  2 VIEW COMPARISON:  None. FINDINGS: The heart size and mediastinal contours are within normal limits. Both lungs are clear. The visualized skeletal structures are unremarkable. IMPRESSION: No active cardiopulmonary disease. Electronically Signed   By: Lovey Newcomer M.D.   On: 08/27/2016 16:30   Ct Abdomen Pelvis W Contrast  Result Date: 08/27/2016 CLINICAL DATA:  Abdominal and back pain for 1 week with heavy vaginal bleeding with clots, chills and weakness today, pale arrival, severe abdominal pain primarily RIGHT  lower quadrant and suprapubic becoming more generalized now EXAM: CT ABDOMEN AND PELVIS WITH CONTRAST TECHNIQUE: Multidetector CT imaging of the abdomen and pelvis was performed using the standard protocol following bolus administration of intravenous contrast. Sagittal and coronal MPR images reconstructed from axial data set. CONTRAST:  121mL ISOVUE-300 IOPAMIDOL (ISOVUE-300)  INJECTION 61% IV. Dilute oral contrast. COMPARISON:  None FINDINGS: Lower chest: Dependent atelectasis at lung bases. Hepatobiliary: Gallbladder surgically absent.  Liver unremarkable. Pancreas: Normal appearance Spleen: Nonspecific low-attenuation focus at superior aspect of spleen anteriorly 14 x 11 mm image 16. Adrenals/Urinary Tract: Adrenal glands normal appearance. BILATERAL renal cysts. Kidneys, ureters, and bladder otherwise normal appearance. Stomach/Bowel: Normal appendix. Stomach contains food debris, otherwise normal appearance. Scattered stool throughout colon without bowel dilatation. Infiltrative changes identified surrounding small bowel loops in the mid abdomen into the upper RIGHT pelvis though none of the small bowel loos demonstrate wall thickening or dilatation. Vascular/Lymphatic: Vascular structures patent.  No adenopathy. Reproductive: Uterus prominent in size at 11.3 x 6.2 x 8.2 cm. Exophytic leiomyoma posterior RIGHT uterus cross fit 3 cm diameter. Additional tiny low-attenuation focus 11 mm diameter within RIGHT uterus could represent small cyst or additional leiomyoma. BILATERAL fallopian tube enlargement and mild dilatation with wall thickening of the RIGHT tube versus LEFT. Findings are suspicious for BILATERAL salpingitis PID. Small cyst within RIGHT ovary 19 x 17 mm. Free fluid adjacent to uterus as well as in the upper RIGHT pelvis into the mid abdomen with associated mild omental edema. Other: No free air. Tiny umbilical hernia. Question nabothian cysts at lower uterine segment. Musculoskeletal: No acute  osseous findings. IMPRESSION: Enlarged uterus with a 3 cm leiomyoma. BILATERAL fallopian tube enlargement and dilatation with wall thickening particularly on RIGHT associated with edema and infiltrative changes in primarily in the RIGHT pelvis, with less edema/fluid adjacent to uterus, raising question of BILATERAL salpingitis/PID; correlation with physical exam for cervical motion tenderness recommended. Small RIGHT ovarian cyst. Small umbilical hernia. Nonspecific 14 x 11 mm lesion within spleen. Electronically Signed   By: Lavonia Dana M.D.   On: 08/27/2016 19:13        Scheduled Meds: . cefOXitin  2 g Intravenous Q6H  . doxycycline (VIBRAMYCIN) IV  100 mg Intravenous Q12H  . megestrol  80 mg Oral BID  . ondansetron (ZOFRAN) IV  4 mg Intravenous Once   Continuous Infusions: . sodium chloride 50 mL/hr at 08/28/16 1600     LOS: 1 day    Time spent: 25 minutes.     Hosie Poisson, MD [facility] Pager [phone]  If 7PM-7AM, please contact night-coverage [email or web address] Password [facility] 08/28/2016, 6:19 PM

## 2016-08-29 DIAGNOSIS — N730 Acute parametritis and pelvic cellulitis: Secondary | ICD-10-CM

## 2016-08-29 LAB — URINE CULTURE

## 2016-08-29 LAB — HEMOGLOBIN AND HEMATOCRIT, BLOOD
HCT: 26 % — ABNORMAL LOW (ref 36.0–46.0)
Hemoglobin: 7.5 g/dL — ABNORMAL LOW (ref 12.0–15.0)

## 2016-08-29 LAB — GC/CHLAMYDIA PROBE AMP (~~LOC~~) NOT AT ARMC
Chlamydia: NEGATIVE
NEISSERIA GONORRHEA: NEGATIVE

## 2016-08-29 LAB — GLUCOSE, CAPILLARY: Glucose-Capillary: 104 mg/dL — ABNORMAL HIGH (ref 65–99)

## 2016-08-29 LAB — PREPARE RBC (CROSSMATCH)

## 2016-08-29 MED ORDER — SODIUM CHLORIDE 0.9 % IV SOLN
Freq: Once | INTRAVENOUS | Status: AC
  Administered 2016-08-29: 21:00:00 via INTRAVENOUS

## 2016-08-29 NOTE — Care Management Note (Signed)
Case Management Note  Patient Details  Name: Kaielle Resta MRN: [id number] Date of Birth: Apr 30, 1966  Subjective/Objective:    CM following for progression and d/c planning.                Action/Plan: 08/29/2016 Noted consult for PCP and OB/GYN , however this pt has commercial insurance therefore should call the number on her card for local doctors who accept her insurance.   Expected Discharge Date:  09/01/16               Expected Discharge Plan:  Home/Self Care  In-House Referral:  NA  Discharge planning Services  CM Consult  Post Acute Care Choice:  NA Choice offered to:  NA  DME Arranged:   NA DME Agency:   NA  HH Arranged:   NA HH Agency:   NA  Status of Service:  Completed, signed off  If discussed at [place] of Stay Meetings, dates discussed:    Additional Comments:  Adron Bene, RN 08/29/2016, 2:10 PM

## 2016-08-29 NOTE — Progress Notes (Signed)
PROGRESS NOTE    Mircle Fedrick  [id number] DOB: 1966/01/19 DOA: 08/27/2016 PCP: No PCP Per Patient    Brief Narrative: Saylem Bargas is a 50 y.o. female with medical history significant for hypertension who presents the emergency department for evaluation of 1 week of progressive abdominal pain and heavy vaginal bleeding since 08/22/2016. She was found to have PID and admitted to the hospital for blood transfusion.    Assessment & Plan:   Principal Problem:   PID (acute pelvic inflammatory disease) Active Problems:   Sepsis, unspecified organism (HCC)   Abnormal uterine bleeding (AUB)   Microcytic anemia   Sepsis ([place])    Sepsis from PID: - Continue current antibiotic therapy   Abnormal uterine bleeding: with microcytic anemia: 1 unit of prbc transfusion ordered.  Repeat hemoglobin is below 8.0, she reports spotting blood. Will transfuse another unit.  Monitor hemoglobin in am.    DVT prophylaxis: scd's Code Status: full code.  Family Communication: none at bedside.  Disposition Plan: pending pain control .   Consultants:   None.    Procedures: none   Antimicrobials: doxycycline, cefoxitin   Subjective: Pt has no new complaints.   Objective: Vitals:   08/28/16 1945 08/28/16 2212 08/29/16 0633 08/29/16 1009  BP: 139/65 (!) 142/79 140/72 (!) 145/72  Pulse: 96 88 85 92  Resp: 17 17 18 18   Temp: 99.2 F (37.3 C) 98.4 F (36.9 C) 98.6 F (37 C) 98.4 F (36.9 C)  TempSrc: Oral Oral Oral Oral  SpO2: 95% 97% 96% 97%  Weight:  106.9 kg (235 lb 10.8 oz)    Height:        Intake/Output Summary (Last 24 hours) at 08/29/16 1804 Last data filed at 08/29/16 1500  Gross per 24 hour  Intake             2800 ml  Output                0 ml  Net             2800 ml   Filed Weights   08/28/16 0142 08/28/16 2212  Weight: 106.7 kg (235 lb 3.7 oz) 106.9 kg (235 lb 10.8 oz)    Examination:  General exam: Appears calm and comfortable , in  nad. Respiratory system: Clear to auscultation. Respiratory effort normal. Cardiovascular system: S1 & S2 heard, RRR. No JVD, murmurs, rubs, gallops or clicks. No pedal edema. Gastrointestinal system: Abdomen is nondistended, soft and nontender. No organomegaly or masses felt. Normal bowel sounds heard. Central nervous system: Alert and oriented. No focal neurological deficits. Extremities: Symmetric 5 x 5 power. Skin: No rashes, lesions or ulcers  Data Reviewed: I have personally reviewed following labs and imaging studies  CBC:  Recent Labs Lab 08/27/16 1545 08/27/16 2302 08/27/16 2310 08/28/16 0300 08/28/16 0945 08/28/16 1833 08/29/16 0401  WBC 10.9* 18.5*  --   --   --   --   --   NEUTROABS 9.8* 16.9*  --   --   --   --   --   HGB 8.7* 7.6* 8.8* 7.1* 7.9* 7.5* 7.5*  HCT 30.1* 27.0* 26.0* 25.4* 28.2* 27.1* 26.0*  MCV 64.0* 64.0*  --   --   --   --   --   PLT 257 234  --   --   --   --   --    Basic Metabolic Panel:  Recent Labs Lab 08/27/16 1545 08/27/16 2310  NA 139 139  K 3.5 3.5  CL 107 103  CO2 25  --   GLUCOSE 152* 177*  BUN 10 12  CREATININE 0.66 0.50  CALCIUM 9.0  --    GFR: Estimated Creatinine Clearance: 97.5 mL/min (by C-G formula based on SCr of 0.5 mg/dL). Liver Function Tests:  Recent Labs Lab 08/27/16 1545  AST 18  ALT 21  ALKPHOS 65  BILITOT 0.4  PROT 6.9  ALBUMIN 3.4*   No results for input(s): LIPASE, AMYLASE in the last 168 hours. No results for input(s): AMMONIA in the last 168 hours. Coagulation Profile:  Recent Labs Lab 08/28/16 0119  INR 1.09   Cardiac Enzymes: No results for input(s): CKTOTAL, CKMB, CKMBINDEX, TROPONINI in the last 168 hours. BNP (last 3 results) No results for input(s): PROBNP in the last 8760 hours. HbA1C: No results for input(s): HGBA1C in the last 72 hours. CBG:  Recent Labs Lab 08/28/16 0811 08/29/16 0750  GLUCAP 112* 104*   Lipid Profile: No results for input(s): CHOL, HDL, LDLCALC, TRIG,  CHOLHDL, LDLDIRECT in the last 72 hours. Thyroid Function Tests: No results for input(s): TSH, T4TOTAL, FREET4, T3FREE, THYROIDAB in the last 72 hours. Anemia Panel:  Recent Labs  08/28/16 0119  VITAMINB12 270  FERRITIN 17  TIBC 295  IRON <5*   Sepsis Labs:  Recent Labs Lab 08/27/16 0120 08/27/16 1827 08/27/16 2317 08/28/16 0119 08/28/16 0336  PROCALCITON  --   --   --  8.09  --   LATICACIDVEN 1.3 2.31* 1.74  --  1.0    Recent Results (from the past 240 hour(s))  Blood culture (routine x 2)     Status: None (Preliminary result)   Collection Time: 08/27/16  4:54 PM  Result Value Ref Range Status   Specimen Description BLOOD RIGHT HAND  Final   Special Requests BOTTLES DRAWN AEROBIC AND ANAEROBIC 5 CC  Final   Culture NO GROWTH 2 DAYS  Final   Report Status PENDING  Incomplete  Blood culture (routine x 2)     Status: None (Preliminary result)   Collection Time: 08/27/16  4:55 PM  Result Value Ref Range Status   Specimen Description BLOOD RIGHT ANTECUBITAL  Final   Special Requests BOTTLES DRAWN AEROBIC AND ANAEROBIC 5CC  Final   Culture NO GROWTH 2 DAYS  Final   Report Status PENDING  Incomplete  Wet prep, genital     Status: Abnormal   Collection Time: 08/27/16  6:10 PM  Result Value Ref Range Status   Yeast Wet Prep HPF POC NONE SEEN NONE SEEN Final   Trich, Wet Prep NONE SEEN NONE SEEN Final   Clue Cells Wet Prep HPF POC NONE SEEN NONE SEEN Final   WBC, Wet Prep HPF POC MANY (A) NONE SEEN Final   Sperm NONE SEEN  Final  Urine culture     Status: Abnormal   Collection Time: 08/27/16  8:52 PM  Result Value Ref Range Status   Specimen Description URINE, RANDOM  Final   Special Requests NONE  Final   Culture MULTIPLE SPECIES PRESENT, SUGGEST RECOLLECTION (A)  Final   Report Status 08/29/2016 FINAL  Final         Radiology Studies: Ct Abdomen Pelvis W Contrast  Result Date: 08/27/2016 CLINICAL DATA:  Abdominal and back pain for 1 week with heavy vaginal  bleeding with clots, chills and weakness today, pale arrival, severe abdominal pain primarily RIGHT lower quadrant and suprapubic becoming more generalized now EXAM: CT ABDOMEN AND PELVIS WITH  CONTRAST TECHNIQUE: Multidetector CT imaging of the abdomen and pelvis was performed using the standard protocol following bolus administration of intravenous contrast. Sagittal and coronal MPR images reconstructed from axial data set. CONTRAST:  155mL ISOVUE-300 IOPAMIDOL (ISOVUE-300) INJECTION 61% IV. Dilute oral contrast. COMPARISON:  None FINDINGS: Lower chest: Dependent atelectasis at lung bases. Hepatobiliary: Gallbladder surgically absent.  Liver unremarkable. Pancreas: Normal appearance Spleen: Nonspecific low-attenuation focus at superior aspect of spleen anteriorly 14 x 11 mm image 16. Adrenals/Urinary Tract: Adrenal glands normal appearance. BILATERAL renal cysts. Kidneys, ureters, and bladder otherwise normal appearance. Stomach/Bowel: Normal appendix. Stomach contains food debris, otherwise normal appearance. Scattered stool throughout colon without bowel dilatation. Infiltrative changes identified surrounding small bowel loops in the mid abdomen into the upper RIGHT pelvis though none of the small bowel loos demonstrate wall thickening or dilatation. Vascular/Lymphatic: Vascular structures patent.  No adenopathy. Reproductive: Uterus prominent in size at 11.3 x 6.2 x 8.2 cm. Exophytic leiomyoma posterior RIGHT uterus cross fit 3 cm diameter. Additional tiny low-attenuation focus 11 mm diameter within RIGHT uterus could represent small cyst or additional leiomyoma. BILATERAL fallopian tube enlargement and mild dilatation with wall thickening of the RIGHT tube versus LEFT. Findings are suspicious for BILATERAL salpingitis PID. Small cyst within RIGHT ovary 19 x 17 mm. Free fluid adjacent to uterus as well as in the upper RIGHT pelvis into the mid abdomen with associated mild omental edema. Other: No free air. Tiny  umbilical hernia. Question nabothian cysts at lower uterine segment. Musculoskeletal: No acute osseous findings. IMPRESSION: Enlarged uterus with a 3 cm leiomyoma. BILATERAL fallopian tube enlargement and dilatation with wall thickening particularly on RIGHT associated with edema and infiltrative changes in primarily in the RIGHT pelvis, with less edema/fluid adjacent to uterus, raising question of BILATERAL salpingitis/PID; correlation with physical exam for cervical motion tenderness recommended. Small RIGHT ovarian cyst. Small umbilical hernia. Nonspecific 14 x 11 mm lesion within spleen. Electronically Signed   By: Lavonia Dana M.D.   On: 08/27/2016 19:13     Scheduled Meds: . cefOXitin  2 g Intravenous Q6H  . doxycycline (VIBRAMYCIN) IV  100 mg Intravenous Q12H  . megestrol  80 mg Oral BID  . ondansetron (ZOFRAN) IV  4 mg Intravenous Once   Continuous Infusions: . sodium chloride 50 mL/hr at 08/28/16 2031     LOS: 2 days    Time spent: 25 minutes.   Velvet Bathe, MD [facility] Pager [phone]  If 7PM-7AM, please contact night-coverage [email or web address] Password [facility] 08/29/2016, 6:04 PM

## 2016-08-30 LAB — TYPE AND SCREEN
ABO/RH(D): A POS
ANTIBODY SCREEN: NEGATIVE
UNIT DIVISION: 0
Unit division: 0

## 2016-08-30 LAB — GLUCOSE, CAPILLARY: Glucose-Capillary: 93 mg/dL (ref 65–99)

## 2016-08-30 LAB — FOLATE RBC
FOLATE, RBC: 1036 ng/mL (ref >498)
Folate, Hemolysate: 252.8 ng/mL
Hematocrit: 24.4 % — ABNORMAL LOW (ref 34.0–46.6)

## 2016-08-30 LAB — HEMOGLOBIN AND HEMATOCRIT, BLOOD
HCT: 28.2 % — ABNORMAL LOW (ref 36.0–46.0)
HEMOGLOBIN: 8.3 g/dL — AB (ref 12.0–15.0)

## 2016-08-30 MED ORDER — FERROUS SULFATE 325 (65 FE) MG PO TABS: 325.0000 mg | ORAL_TABLET | Freq: Two times a day (BID) | ORAL | 0 refills | Status: AC

## 2016-08-30 MED ORDER — MEGESTROL ACETATE 40 MG PO TABS: 80.0000 mg | ORAL_TABLET | Freq: Two times a day (BID) | ORAL | 0 refills | Status: AC

## 2016-08-30 MED ORDER — DOXYCYCLINE HYCLATE 100 MG PO CAPS: 100.0000 mg | ORAL_CAPSULE | Freq: Two times a day (BID) | ORAL | 0 refills | Status: AC

## 2016-08-30 MED ORDER — CEFDINIR 300 MG PO CAPS: 300.0000 mg | ORAL_CAPSULE | Freq: Two times a day (BID) | ORAL | 0 refills | Status: DC

## 2016-08-30 NOTE — Progress Notes (Signed)
Sharon Manning to be D/C'd Home per MD order.  Discussed prescriptions and follow up appointments with the patient. Prescriptions given to patient, medication list explained in detail. Pt verbalized understanding.    Medication List    TAKE these medications   ADVIL 200 MG tablet Generic drug:  ibuprofen Take 800 mg by mouth 2 (two) times daily as needed for moderate pain.   cefdinir 300 MG capsule Commonly known as:  OMNICEF Take 1 capsule (300 mg total) by mouth 2 (two) times daily.   doxycycline 100 MG capsule Commonly known as:  VIBRAMYCIN Take 1 capsule (100 mg total) by mouth 2 (two) times daily.   ferrous sulfate 325 (65 FE) MG tablet Take 1 tablet (325 mg total) by mouth 2 (two) times daily with a meal.   megestrol 40 MG tablet Commonly known as:  MEGACE Take 2 tablets (80 mg total) by mouth 2 (two) times daily.   OVER THE COUNTER MEDICATION Take 1 tablet by mouth at bedtime as needed (for restless leg syndrome). Legatrin       Vitals:   08/30/16 0451 08/30/16 0924  BP: (!) 153/74 (!) 165/72  Pulse: 82 74  Resp: 19 16  Temp: 98.1 F (36.7 C) 98.2 F (36.8 C)    Skin clean, dry and intact without evidence of skin break down, no evidence of skin tears noted. IV catheter discontinued intact. Site without signs and symptoms of complications. Dressing and pressure applied. Pt denies pain at this time. No complaints noted.  An After Visit Summary was printed and given to the patient. Patient escorted via [place], and D/C home via private auto.  Retta Mac BSN, RN

## 2016-08-30 NOTE — Discharge Summary (Signed)
Physician Discharge Summary  Sharon Manning [id number] DOB: Feb 16, 1966 DOA: 08/27/2016  PCP: No PCP Per Patient  Admit date: 08/27/2016 Discharge date: 08/30/2016  Time spent: > 35 minutes  Recommendations for Outpatient Follow-up:  1. Monitor blood levels 2. Ensure patient continues antibiotic regimen to complete a 14 day total treatment course 3. Patient to follow-up with OB/GYN to discuss continuation of Megace   Discharge Diagnoses:  Principal Problem:   PID (acute pelvic inflammatory disease) Active Problems:   Sepsis, unspecified organism ([place])   Abnormal uterine bleeding (AUB)   Microcytic anemia   Sepsis ([place])   Discharge Condition: Stable  Diet recommendation: regular diet  Filed Weights   08/28/16 0142 08/28/16 2212 08/29/16 2111  Weight: 106.7 kg (235 lb 3.7 oz) 106.9 kg (235 lb 10.8 oz) 107.1 kg (236 lb 1.8 oz)    History of present illness:  Sharon Antwineis a 50 y.o.femalewith medical history significant for hypertension who presents the emergency department for evaluation of 1 week of progressive abdominal pain and heavy vaginal bleeding since 08/22/2016. She was found to have PID and admitted to the hospital for blood transfusion.   Hospital Course:  Sepsis from PID: - We'll discharge on oral antibiotics to complete 11 more days to complete a total of 14 day total treatment course - We'll discharge on doxycycline and Omnicef   Abnormal uterine bleeding: with microcytic anemia: Patient required blood transfusions. Reporting cessation of bleeding. She is looking forward to going home and is okay with discharging home. Hemoglobin levels stable, I have recommended patient follow-up within the next week with her primary care physician or sooner should should any new concerns arise. He verbalizes understanding and agreement. Will discharge on iron supplementation Continue Megace   Procedures:  None  Consultations:  None  Discharge  Exam: Vitals:   08/30/16 0451 08/30/16 0924  BP: (!) 153/74 (!) 165/72  Pulse: 82 74  Resp: 19 16  Temp: 98.1 F (36.7 C) 98.2 F (36.8 C)    General: Patient in no acute distress, alert and awake Cardiovascular: Regular rate and rhythm, no murmurs or rubs Respiratory: No increased work of breathing or wheezes  Discharge Instructions   Discharge Instructions    Call MD for:  extreme fatigue    Complete by:  As directed    Call MD for:  temperature >100.4    Complete by:  As directed    Diet - low sodium heart healthy    Complete by:  As directed    Discharge instructions    Complete by:  As directed    Please be sure to have your cbc checked within the next 1 week.   Increase activity slowly    Complete by:  As directed      Current Discharge Medication List    START taking these medications   Details  cefdinir (OMNICEF) 300 MG capsule Take 1 capsule (300 mg total) by mouth 2 (two) times daily. Qty: 8 capsule, Refills: 0    doxycycline (VIBRAMYCIN) 100 MG capsule Take 1 capsule (100 mg total) by mouth 2 (two) times daily. Qty: 22 capsule, Refills: 0    ferrous sulfate 325 (65 FE) MG tablet Take 1 tablet (325 mg total) by mouth 2 (two) times daily with a meal. Qty: 60 tablet, Refills: 0    megestrol (MEGACE) 40 MG tablet Take 2 tablets (80 mg total) by mouth 2 (two) times daily. Qty: 20 tablet, Refills: 0      CONTINUE these medications which  have NOT CHANGED   Details  ibuprofen (ADVIL) 200 MG tablet Take 800 mg by mouth 2 (two) times daily as needed for moderate pain.    OVER THE COUNTER MEDICATION Take 1 tablet by mouth at bedtime as needed (for restless leg syndrome). Legatrin       No Known Allergies    The results of significant diagnostics from this hospitalization (including imaging, microbiology, ancillary and laboratory) are listed below for reference.    Significant Diagnostic Studies: Dg Chest 2 View  Result Date: 08/27/2016 CLINICAL DATA:   Patient with heavy menstrual bleeding. Dizziness. Syncope. EXAM: CHEST  2 VIEW COMPARISON:  None. FINDINGS: The heart size and mediastinal contours are within normal limits. Both lungs are clear. The visualized skeletal structures are unremarkable. IMPRESSION: No active cardiopulmonary disease. Electronically Signed   By: Sharon Manning M.D.   On: 08/27/2016 16:30   Ct Abdomen Pelvis W Contrast  Result Date: 08/27/2016 CLINICAL DATA:  Abdominal and back pain for 1 week with heavy vaginal bleeding with clots, chills and weakness today, pale arrival, severe abdominal pain primarily RIGHT lower quadrant and suprapubic becoming more generalized now EXAM: CT ABDOMEN AND PELVIS WITH CONTRAST TECHNIQUE: Multidetector CT imaging of the abdomen and pelvis was performed using the standard protocol following bolus administration of intravenous contrast. Sagittal and coronal MPR images reconstructed from axial data set. CONTRAST:  114mL ISOVUE-300 IOPAMIDOL (ISOVUE-300) INJECTION 61% IV. Dilute oral contrast. COMPARISON:  None FINDINGS: Lower chest: Dependent atelectasis at lung bases. Hepatobiliary: Gallbladder surgically absent.  Liver unremarkable. Pancreas: Normal appearance Spleen: Nonspecific low-attenuation focus at superior aspect of spleen anteriorly 14 x 11 mm image 16. Adrenals/Urinary Tract: Adrenal glands normal appearance. BILATERAL renal cysts. Kidneys, ureters, and bladder otherwise normal appearance. Stomach/Bowel: Normal appendix. Stomach contains food debris, otherwise normal appearance. Scattered stool throughout colon without bowel dilatation. Infiltrative changes identified surrounding small bowel loops in the mid abdomen into the upper RIGHT pelvis though none of the small bowel loos demonstrate wall thickening or dilatation. Vascular/Lymphatic: Vascular structures patent.  No adenopathy. Reproductive: Uterus prominent in size at 11.3 x 6.2 x 8.2 cm. Exophytic leiomyoma posterior RIGHT uterus cross fit 3  cm diameter. Additional tiny low-attenuation focus 11 mm diameter within RIGHT uterus could represent small cyst or additional leiomyoma. BILATERAL fallopian tube enlargement and mild dilatation with wall thickening of the RIGHT tube versus LEFT. Findings are suspicious for BILATERAL salpingitis PID. Small cyst within RIGHT ovary 19 x 17 mm. Free fluid adjacent to uterus as well as in the upper RIGHT pelvis into the mid abdomen with associated mild omental edema. Other: No free air. Tiny umbilical hernia. Question nabothian cysts at lower uterine segment. Musculoskeletal: No acute osseous findings. IMPRESSION: Enlarged uterus with a 3 cm leiomyoma. BILATERAL fallopian tube enlargement and dilatation with wall thickening particularly on RIGHT associated with edema and infiltrative changes in primarily in the RIGHT pelvis, with less edema/fluid adjacent to uterus, raising question of BILATERAL salpingitis/PID; correlation with physical exam for cervical motion tenderness recommended. Small RIGHT ovarian cyst. Small umbilical hernia. Nonspecific 14 x 11 mm lesion within spleen. Electronically Signed   By: Lavonia Dana M.D.   On: 08/27/2016 19:13    Microbiology: Recent Results (from the past 240 hour(s))  Blood culture (routine x 2)     Status: None (Preliminary result)   Collection Time: 08/27/16  4:54 PM  Result Value Ref Range Status   Specimen Description BLOOD RIGHT HAND  Final   Special Requests BOTTLES DRAWN  AEROBIC AND ANAEROBIC 5 CC  Final   Culture NO GROWTH 3 DAYS  Final   Report Status PENDING  Incomplete  Blood culture (routine x 2)     Status: None (Preliminary result)   Collection Time: 08/27/16  4:55 PM  Result Value Ref Range Status   Specimen Description BLOOD RIGHT ANTECUBITAL  Final   Special Requests BOTTLES DRAWN AEROBIC AND ANAEROBIC 5CC  Final   Culture NO GROWTH 3 DAYS  Final   Report Status PENDING  Incomplete  Wet prep, genital     Status: Abnormal   Collection Time:  08/27/16  6:10 PM  Result Value Ref Range Status   Yeast Wet Prep HPF POC NONE SEEN NONE SEEN Final   Trich, Wet Prep NONE SEEN NONE SEEN Final   Clue Cells Wet Prep HPF POC NONE SEEN NONE SEEN Final   WBC, Wet Prep HPF POC MANY (A) NONE SEEN Final   Sperm NONE SEEN  Final  Urine culture     Status: Abnormal   Collection Time: 08/27/16  8:52 PM  Result Value Ref Range Status   Specimen Description URINE, RANDOM  Final   Special Requests NONE  Final   Culture MULTIPLE SPECIES PRESENT, SUGGEST RECOLLECTION (A)  Final   Report Status 08/29/2016 FINAL  Final     Labs: Basic Metabolic Panel:  Recent Labs Lab 08/27/16 1545 08/27/16 2310  NA 139 139  K 3.5 3.5  CL 107 103  CO2 25  --   GLUCOSE 152* 177*  BUN 10 12  CREATININE 0.66 0.50  CALCIUM 9.0  --    Liver Function Tests:  Recent Labs Lab 08/27/16 1545  AST 18  ALT 21  ALKPHOS 65  BILITOT 0.4  PROT 6.9  ALBUMIN 3.4*   No results for input(s): LIPASE, AMYLASE in the last 168 hours. No results for input(s): AMMONIA in the last 168 hours. CBC:  Recent Labs Lab 08/27/16 1545 08/27/16 2302  08/28/16 0300 08/28/16 0945 08/28/16 1833 08/29/16 0401 08/30/16 0330  WBC 10.9* 18.5*  --   --   --   --   --   --   NEUTROABS 9.8* 16.9*  --   --   --   --   --   --   HGB 8.7* 7.6*  < > 7.1* 7.9* 7.5* 7.5* 8.3*  HCT 30.1* 27.0*  < > 25.4* 28.2* 27.1* 26.0* 28.2*  MCV 64.0* 64.0*  --   --   --   --   --   --   PLT 257 234  --   --   --   --   --   --   < > = values in this interval not displayed. Cardiac Enzymes: No results for input(s): CKTOTAL, CKMB, CKMBINDEX, TROPONINI in the last 168 hours. BNP: BNP (last 3 results) No results for input(s): BNP in the last 8760 hours.  ProBNP (last 3 results) No results for input(s): PROBNP in the last 8760 hours.  CBG:  Recent Labs Lab 08/28/16 0811 08/29/16 0750 08/30/16 0755  GLUCAP 112* 104* 93    Signed:  Velvet Bathe MD.  [facility] 08/30/2016,  1:44 PM

## 2016-09-01 LAB — CULTURE, BLOOD (ROUTINE X 2)
CULTURE: NO GROWTH
CULTURE: NO GROWTH

## 2017-04-20 ENCOUNTER — Other Ambulatory Visit: Payer: Self-pay | Admitting: Family Medicine

## 2017-04-20 ENCOUNTER — Ambulatory Visit
Admission: RE | Admit: 2017-04-20 | Discharge: 2017-04-20 | Disposition: A | Discharge status: Home / Self Care | Payer: Managed Care, Other (non HMO) | Source: Ambulatory Visit | Attending: Family Medicine | Admitting: Family Medicine

## 2017-04-20 DIAGNOSIS — M1712 Unilateral primary osteoarthritis, left knee: Secondary | ICD-10-CM

## 2017-07-31 IMAGING — CT CT ABD-PELV W/ CM
1 of 5 series · 6 of 46 positions shown, 11 images · IV contrast (Iodine)
Comparison: None

CLINICAL DATA: Abdominal and back pain for 1 week with heavy
vaginal bleeding with clots, chills and weakness today, pale
arrival, severe abdominal pain primarily RIGHT lower quadrant and
suprapubic becoming more generalized now

EXAM:
CT ABDOMEN AND PELVIS WITH CONTRAST
TECHNIQUE: Multidetector CT imaging of the abdomen and pelvis was performed
using the standard protocol following bolus administration of
intravenous contrast. Sagittal and coronal MPR images reconstructed
from axial data set.
CONTRAST:  100mL BAE78Y-IUU IOPAMIDOL (BAE78Y-IUU) INJECTION 61% IV.
Dilute oral contrast.

[Series 206: lungs · axial · 0.70mm/px · z∈[-491,-401]mm · 6 of 52 slices shown, 11 images]
[im 8/52  soft-tissue]
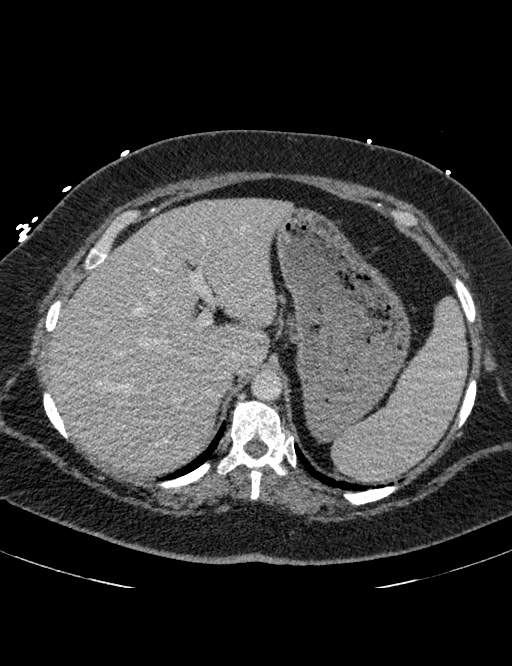
[im 8/52  bone]
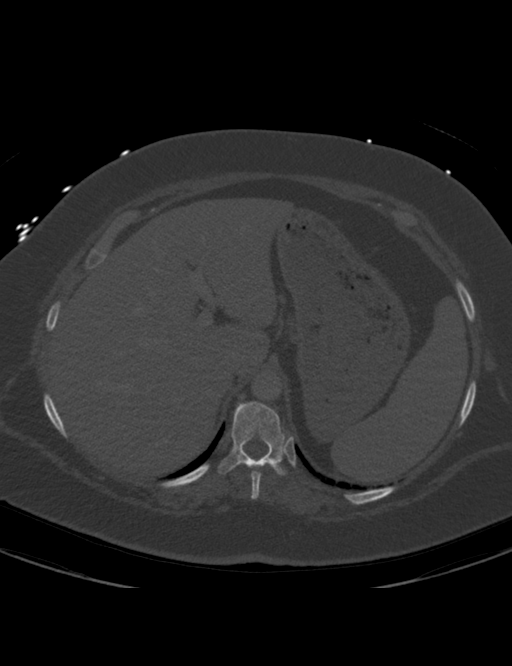
[im 15/52  soft-tissue]
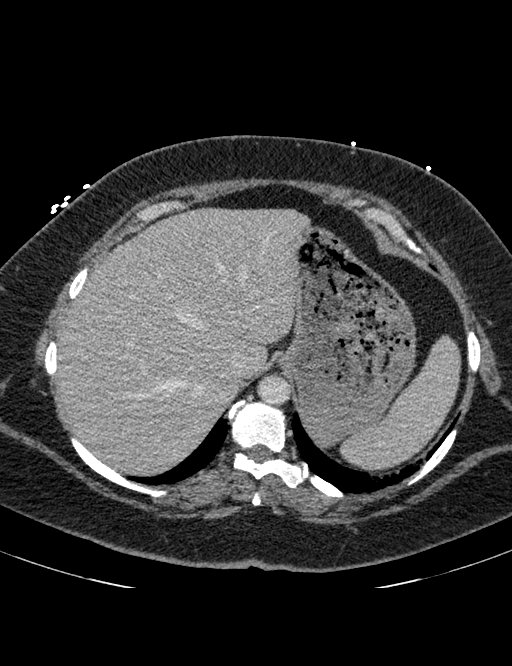
[im 22/52  soft-tissue]
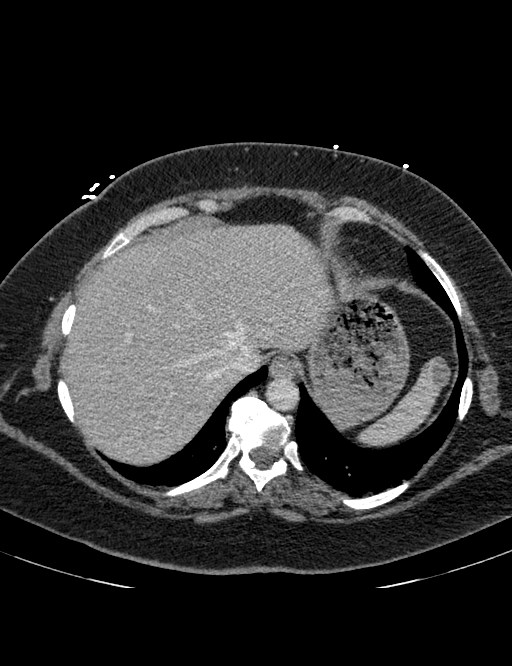
[im 22/52  lung]
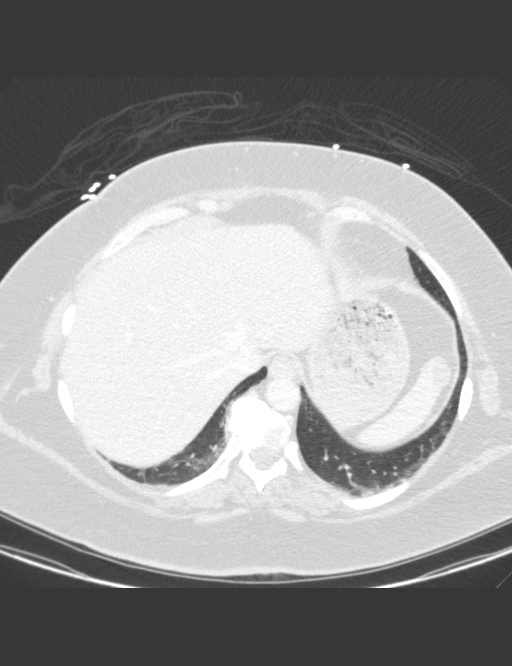
[im 30/52  soft-tissue]
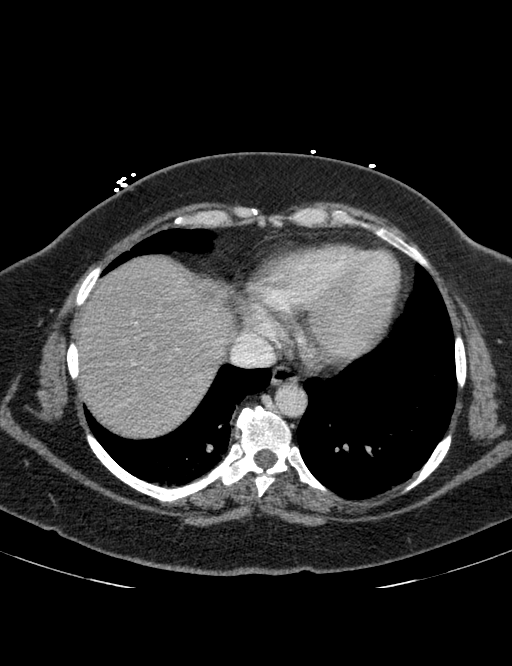
[im 30/52  lung]
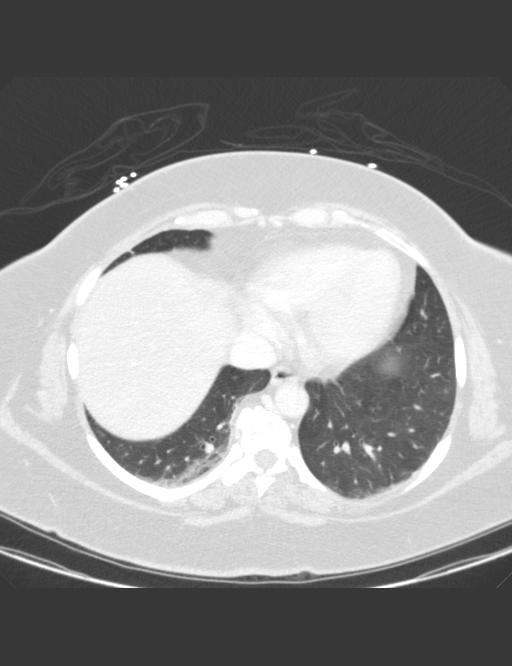
[im 37/52  soft-tissue]
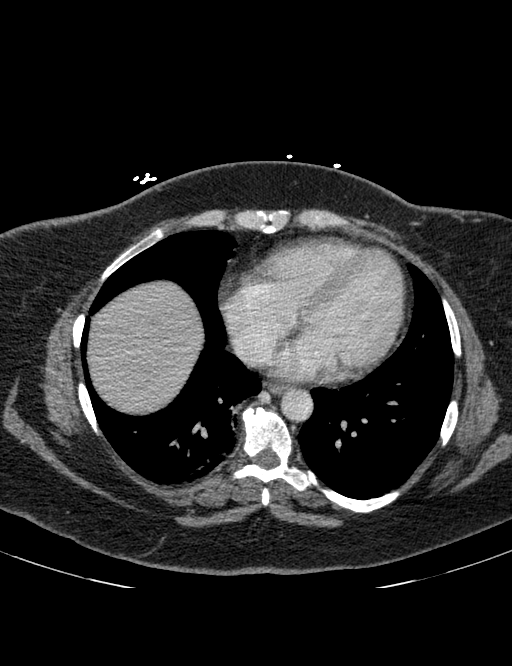
[im 37/52  lung]
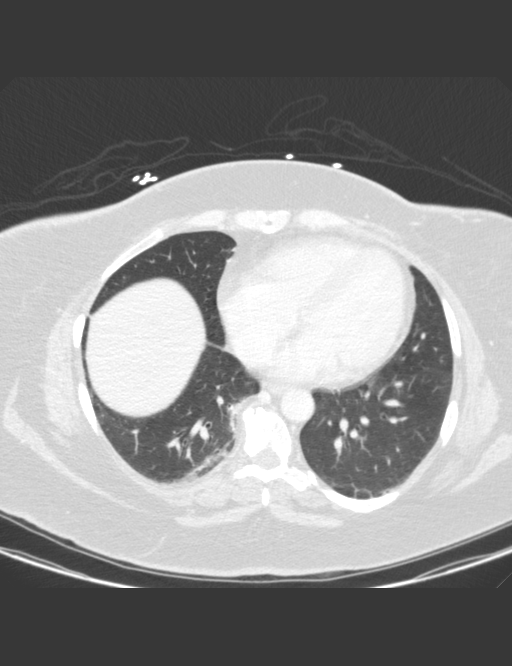
[im 44/52  soft-tissue]
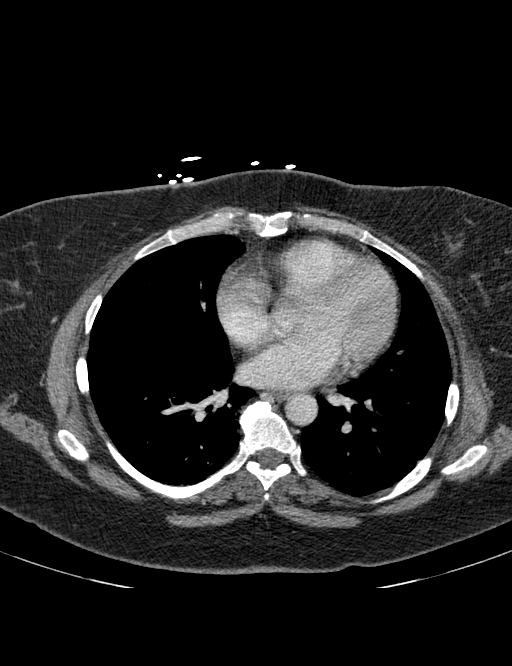
[im 44/52  lung]
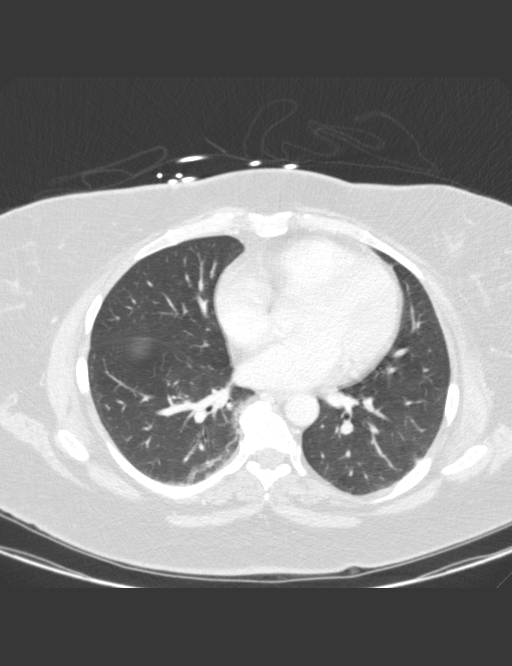

[6 of 46 positions shown; findings below may reference images not displayed]

FINDINGS: Lower chest: Dependent atelectasis at lung bases.

Hepatobiliary: Gallbladder surgically absent.  Liver unremarkable.

Pancreas: Normal appearance

Spleen: Nonspecific low-attenuation focus at superior aspect of
spleen anteriorly 14 x 11 mm image 16.

Adrenals/Urinary Tract: Adrenal glands normal appearance. BILATERAL
renal cysts. Kidneys, ureters, and bladder otherwise normal
appearance.

Stomach/Bowel: Normal appendix. Stomach contains food debris,
otherwise normal appearance. Scattered stool throughout colon
without bowel dilatation. Infiltrative changes identified
surrounding small bowel loops in the mid abdomen into the upper
RIGHT pelvis though none of the small bowel loos demonstrate wall
thickening or dilatation.

Vascular/Lymphatic: Vascular structures patent.  No adenopathy.

Reproductive: Uterus prominent in size at 11.3 x 6.2 x 8.2 cm.
Exophytic leiomyoma posterior RIGHT uterus cross fit 3 cm diameter.
Additional tiny low-attenuation focus 11 mm diameter within RIGHT
uterus could represent small cyst or additional leiomyoma. BILATERAL
fallopian tube enlargement and mild dilatation with wall thickening
of the RIGHT tube versus LEFT. Findings are suspicious for BILATERAL
salpingitis PID. Small cyst within RIGHT ovary 19 x 17 mm. Free
fluid adjacent to uterus as well as in the upper RIGHT pelvis into
the mid abdomen with associated mild omental edema.

Other: No free air. Tiny umbilical hernia. Question nabothian cysts
at lower uterine segment.

Musculoskeletal: No acute osseous findings.
IMPRESSION: Enlarged uterus with a 3 cm leiomyoma.

BILATERAL fallopian tube enlargement and dilatation with wall
thickening particularly on RIGHT associated with edema and
infiltrative changes in primarily in the RIGHT pelvis, with less
edema/fluid adjacent to uterus, raising question of BILATERAL
salpingitis/PID; correlation with physical exam for cervical motion
tenderness recommended.

Small RIGHT ovarian cyst.

Small umbilical hernia.

Nonspecific 14 x 11 mm lesion within spleen.

## 2018-03-24 IMAGING — CR DG KNEE 1-2V*L*
2 series · 2 of 2 positions shown · non-contrast
Comparison: None.

CLINICAL DATA: 50-year-old female with progressive left knee pain

EXAM:
LEFT KNEE - 1-2 VIEW

[w knee ap left]
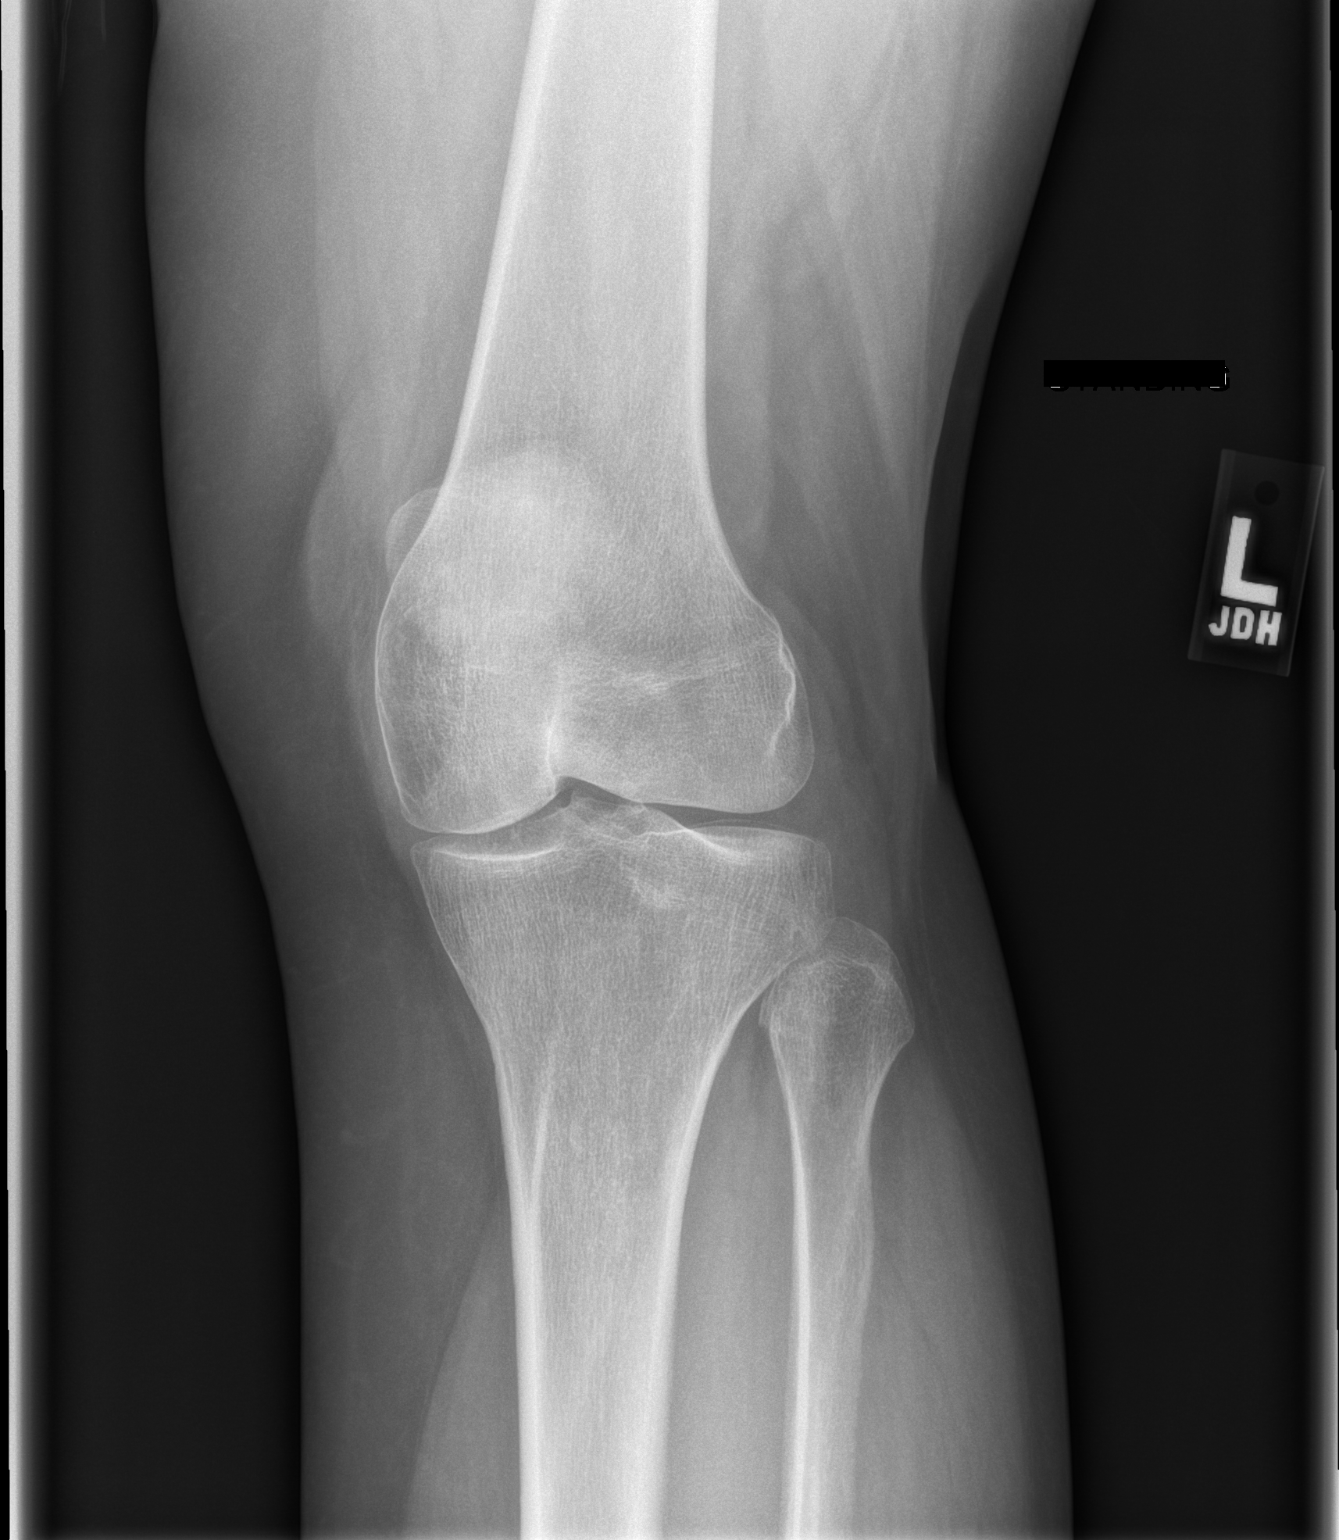

[w knee lat left]
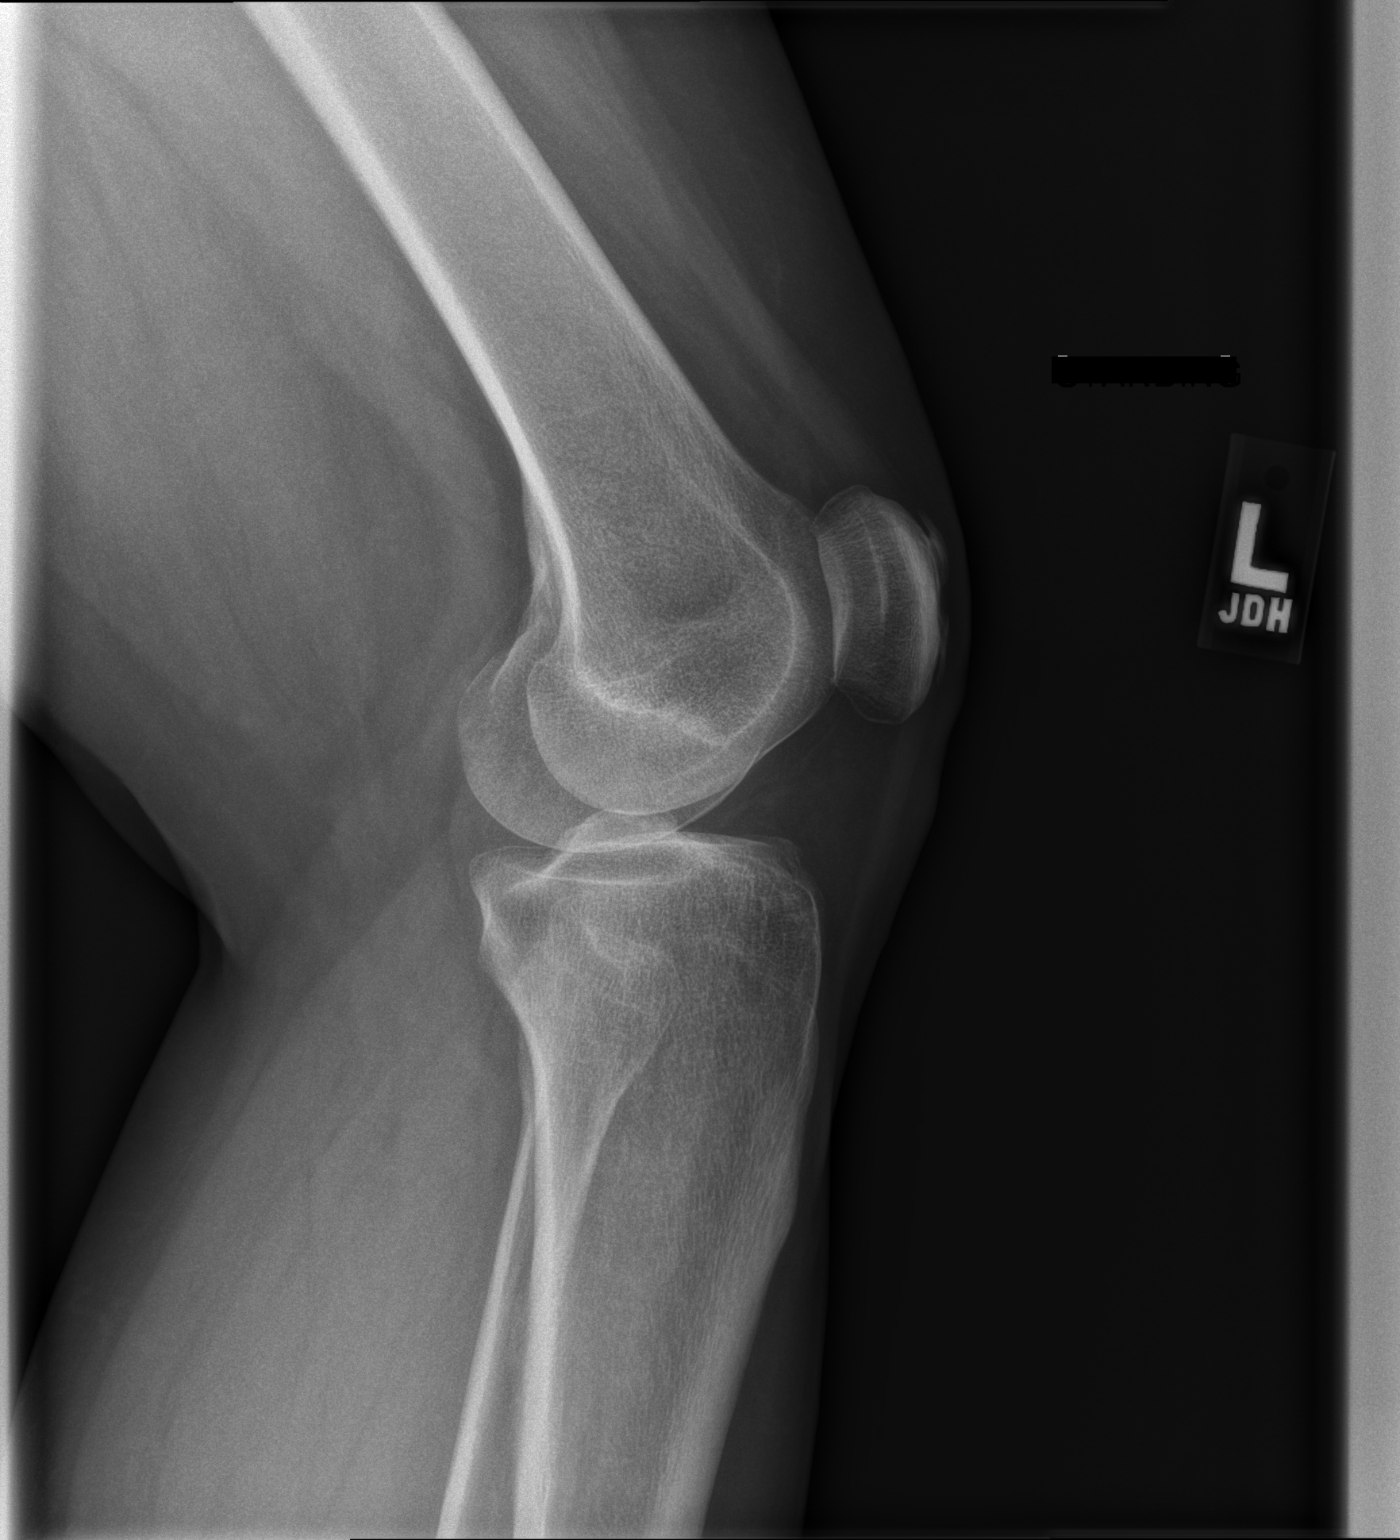

[2 of 2 positions shown; findings below may reference images not displayed]

FINDINGS: No evidence of acute fracture, malalignment or knee joint effusion.
Normal bony mineralization, no lytic or blastic osseous lesion.
Perhaps mild narrowing of the medial compartmental joint space
without evidence of significant degenerative osteoarthritis. The
soft tissues are unremarkable.
IMPRESSION: 1. Perhaps mild narrowing of the medial compartmental joint space
without other secondary features to suggest osteoarthritis.
2. No evidence of fracture, malalignment or knee joint effusion.

## 2019-11-04 ENCOUNTER — Other Ambulatory Visit: Payer: Self-pay

## 2019-11-04 DIAGNOSIS — Z20822 Contact with and (suspected) exposure to covid-19: Secondary | ICD-10-CM

## 2019-11-05 LAB — NOVEL CORONAVIRUS, NAA: SARS-CoV-2, NAA: NOT DETECTED

## 2019-11-19 ENCOUNTER — Other Ambulatory Visit: Payer: Self-pay

## 2019-11-19 DIAGNOSIS — Z20822 Contact with and (suspected) exposure to covid-19: Secondary | ICD-10-CM

## 2019-11-21 LAB — NOVEL CORONAVIRUS, NAA: SARS-CoV-2, NAA: NOT DETECTED

## 2020-08-05 ENCOUNTER — Ambulatory Visit: Payer: Managed Care, Other (non HMO) | Attending: Orthopedic Surgery

## 2020-08-05 ENCOUNTER — Other Ambulatory Visit: Payer: Self-pay

## 2020-08-05 DIAGNOSIS — M25672 Stiffness of left ankle, not elsewhere classified: Secondary | ICD-10-CM

## 2020-08-05 DIAGNOSIS — R2689 Other abnormalities of gait and mobility: Secondary | ICD-10-CM | POA: Diagnosis present

## 2020-08-05 DIAGNOSIS — M6788 Other specified disorders of synovium and tendon, other site: Secondary | ICD-10-CM

## 2020-08-05 DIAGNOSIS — M25572 Pain in left ankle and joints of left foot: Secondary | ICD-10-CM

## 2020-08-05 NOTE — Therapy (Signed)
[place], [place], [address] Phone: [phone]   Fax:  [phone]  Physical Therapy Evaluation  Patient Details  Name: Sharon Manning MRN: [id number] Date of Birth: 12-26-1965 Referring Provider (PT): Daws, Janetta Hora, MD   Encounter Date: 08/05/2020    Past Medical History:  Diagnosis Date  . Hypertension     History reviewed. No pertinent surgical history.  There were no vitals filed for this visit.    Subjective Assessment - 08/05/20 1607    Subjective Pt reports L achilles/ ankle pain for the past 6 months. Pt is on her feet extended periods due to her job. Pt states using a walking boot has been beneficial in decreasing the pain.    Limitations Standing;Walking    How long can you sit comfortably? No issue    How long can you stand comfortably? 1 hour    How long can you walk comfortably? 1 hour    Patient Stated Goals To have less pain and tolerate my job better    Currently in Pain? Yes    Pain Score 2    8-10/10 at end of work day   Pain Location Ankle   Achilles   Pain Orientation Left;Posterior;Lateral    Pain Descriptors / Indicators Aching    Pain Type Chronic pain    Pain Onset Other (comment)   6 months   Pain Frequency Constant    Aggravating Factors  Walking, standing    Pain Relieving Factors Rest    Effect of Pain on Daily Activities Pushes through pain at work              [facility] PT Assessment - 08/05/20 0001      Assessment   Medical Diagnosis Achilles tendonosis , Lt    Referring Provider (PT) Daws, Janetta Hora, MD    Onset Date/Surgical Date --   6 months   Hand Dominance Right    Prior Therapy No      Precautions   Precautions None      Restrictions   Weight Bearing Restrictions No      Balance Screen   Has the patient fallen in the past 6 months No      [place] residence    Living Arrangements Parent    Type of [place] Access Level entry    [facility] --   walking boot     Prior Function   Level of Independence Independent    Vocation Full time employment    Dispensing optician- [employer]      Cognition   Overall Cognitive Status Within Functional Limits for tasks assessed      Observation/Other Assessments   Focus on Therapeutic Outcomes (FOTO)  53% Limitation      Sensation   Light Touch Appears Intact      Posture/Postural Control   Posture Comments pes cavus      ROM / Strength   AROM / PROM / Strength AROM;PROM;Strength      AROM   AROM Assessment Site Ankle    Right/Left Ankle Right;Left    Right Ankle Dorsiflexion 0    Left Ankle Dorsiflexion 0      PROM   PROM Assessment Site Ankle    Right/Left Ankle Right;Left    Right Ankle Dorsiflexion 5    Left Ankle Dorsiflexion 5  Strength   Strength Assessment Site Ankle    Right/Left Ankle Right;Left    Right Ankle Dorsiflexion 5/5    Right Ankle Plantar Flexion 5/5    Right Ankle Inversion 5/5    Right Ankle Eversion 5/5    Left Ankle Dorsiflexion 5/5    Left Ankle Plantar Flexion 5/5    Left Ankle Inversion 5/5    Left Ankle Eversion 5/5      Palpation   Palpation comment Minimally tender to palpation to the post/inf  lateral mallelous. Not tender to the achilles tendon or it's insertion to the calaneous, or the heel or plantar fascia      Ambulation/Gait   Gait Pattern Step-through pattern   Out toeing,neutral/medial heel strike c pronation                         Objective measurements completed on examination: See above findings.       [facility] Adult PT Treatment/Exercise - 08/05/20 0001      Exercises   Exercises Ankle      Ankle Exercises: Stretches   Plantar Fascia Stretch 2 reps;30 seconds    Soleus Stretch 2 reps;20 seconds                  PT Education - 08/05/20 1405    Education Details Eval findings, POC,  HEP, Ice pack/ice bath for pain management    Person(s) Educated Patient    Methods Explanation;Demonstration;Tactile cues;Verbal cues;Handout    Comprehension Verbalized understanding;Returned demonstration;Verbal cues required;Tactile cues required;Need further instruction            PT Short Term Goals - 08/05/20 1603      PT SHORT TERM GOAL #1   Title Pt will be Ind in an Initial HEP    Baseline Started day of eval    Time 3    Period Weeks    Status New    Target Date 08/26/20      PT SHORT TERM GOAL #2   Title Pt will voice understanding of symptom management, ie ice pack, massage, proper footwear    Time 3    Period Weeks    Target Date 08/26/20             PT Long Term Goals - 08/05/20 1605      PT LONG TERM GOAL #1   Title Pt will be Ind in a final HEP    Time 7    Period Weeks    Status New    Target Date 09/23/20      PT LONG TERM GOAL #2   Title Pt will report being able to walk a mile c moderate difficulty    Baseline Extreme difficulty    Time 7    Period Weeks    Status New    Target Date 09/23/20      PT LONG TERM GOAL #3   Title Pt will report tolerating daily activities with a little bit of difficulty    Baseline Moderate difficulty    Time 7    Period Weeks    Status New    Target Date 09/23/20      PT LONG TERM GOAL #4   Title FOTO score will improve to 39% Limitation    Baseline 53% Limitation    Time 7    Period Weeks    Status New    Target Date 09/23/20      PT  LONG TERM GOAL #5   Title pt will report a decrease in L ankle/achilles pain level to 4/10 or less following a day of work    Baseline 8-10/10    Time 7    Period Weeks    Status New    Target Date 09/23/20                  Plan - 08/05/20 1541    Clinical Impression Statement Pt presents with a 6 month Hx of L ankle and achilles pain which is aggrevated by work where she is on her feet standing and walking for the majority of the day. After 2 days off  from work, pt presents with a low level of pain which was only provoked minimally by palpation along the post/inf lateral mallelous. AROM ankle DF was limited bilat, while bilat ankle strength was WNLs. Pt. demonstrated a gait pattern deficit L with neutral to medial heel strike and pronation to heel off, and an ankle whip. Discussed the impact of repetive stresses, obtaining proper foot wear due the prolonged time frame on feet at work, use of ice pack/ice bath for pain management, and HEP for stretching of the PFs. Pt will benefit from PT 2w6 for symptom management and ROM and strengthening for the L ankle/LE to reduce pain and maximize functional mobility.    Personal Factors and Comorbidities Past/Current Experience;Profession;Comorbidity 1    Comorbidities Obese    Examination-Activity Limitations Locomotion Level    Stability/Clinical Decision Making Stable/Uncomplicated    Clinical Decision Making Low    Rehab Potential Good    PT Frequency 2x / week    PT Duration 6 weeks    PT Treatment/Interventions ADLs/Self Care Home Management;Electrical Stimulation;Iontophoresis 4mg /ml Dexamethasone;Moist Heat;Ultrasound;Gait training;Stair training;Functional mobility training;Therapeutic activities;Therapeutic exercise;Manual techniques;Passive range of motion;Dry needling;Taping    PT Next Visit Plan Assess response to symptom management and HEP    PT Home Exercise Plan [id number]: Gastroc and soleus stretches    Consulted and Agree with Plan of Care Patient           Patient will benefit from skilled therapeutic intervention in order to improve the following deficits and impairments:  Abnormal gait, Decreased range of motion, Difficulty walking, Obesity, Decreased activity tolerance, Pain, Decreased strength  Visit Diagnosis: Pain in left ankle and joints of left foot  Achilles tendonosis of left lower extremity  Decreased range of motion of left ankle  Other abnormalities of gait and  mobility     Problem List Patient Active Problem List   Diagnosis Date Noted  . PID (acute pelvic inflammatory disease) 08/27/2016  . Sepsis, unspecified organism ([place]) 08/27/2016  . Abnormal uterine bleeding (AUB) 08/27/2016  . Microcytic anemia 08/27/2016  . Sepsis ([place]) 08/27/2016   Gar Ponto MS, PT 08/05/20 7:36 PM  [facility] [address] [place], [place], [address] Phone: [phone]   Fax:  [phone]  Name: Sharon Manning MRN: [id number] Date of Birth: 01/14/1966

## 2020-08-11 ENCOUNTER — Other Ambulatory Visit: Payer: Self-pay

## 2020-08-11 ENCOUNTER — Ambulatory Visit: Payer: Managed Care, Other (non HMO)

## 2020-08-11 DIAGNOSIS — R2689 Other abnormalities of gait and mobility: Secondary | ICD-10-CM

## 2020-08-11 DIAGNOSIS — M25672 Stiffness of left ankle, not elsewhere classified: Secondary | ICD-10-CM

## 2020-08-11 DIAGNOSIS — M25572 Pain in left ankle and joints of left foot: Secondary | ICD-10-CM

## 2020-08-11 DIAGNOSIS — M6788 Other specified disorders of synovium and tendon, other site: Secondary | ICD-10-CM

## 2020-08-11 NOTE — Patient Instructions (Signed)
Eccentric calf protocol 1 set knee bent 1 set knee extended x 15 resp  She needed to do this from the floor as the DF caused dorsal ankle pain.she will do this 1x/day for a week and progress as able

## 2020-08-11 NOTE — Therapy (Signed)
[place], [place], [address] Phone: [phone]   Fax:  [phone]  Physical Therapy Treatment  Patient Details  Name: Sharon Manning MRN: [id number] Date of Birth: [birth year] Referring Provider (PT): Daws, Janetta Hora, MD   Encounter Date: 08/11/2020   PT End of Session - 08/11/20 1445    Visit Number 2    Number of Visits 13    Date for PT Re-Evaluation 09/25/20    Authorization Type [employer] MANAGED    Progress Note Due on Visit 10    PT Start Time 0245    PT Stop Time 0330    PT Time Calculation (min) 45 min    Activity Tolerance Patient tolerated treatment well    Behavior During Therapy [facility] for tasks assessed/performed           Past Medical History:  Diagnosis Date  . Hypertension     History reviewed. No pertinent surgical history.  There were no vitals filed for this visit.   Subjective Assessment - 08/11/20 1448    Subjective I'm doing better with new shoes and helps alot.   She did HEP 4  x since eval.  Topof foot has some pain at times.    Currently in Pain? No/denies                             [facility] Adult PT Treatment/Exercise - 08/11/20 0001      Manual Therapy   Manual Therapy Joint mobilization;Manual Traction    Joint Mobilization MWM AP pressure to talus with activ ankle and knee flexion . Modified direction to ease knee pain    Manual Traction LT ankle      Ankle Exercises: Stretches   Plantar Fascia Stretch 2 reps;30 seconds    Soleus Stretch 2 reps;20 seconds      Ankle Exercises: Standing   Other Standing Ankle Exercises eccentric PF from step but moved to floor due to dorsal ankle foot pain.   Sore post but by end of session pain in heel cord had eased                  PT Education - 08/11/20 1543    Education Details HEP    Person(s) Educated Patient    Methods Explanation;Verbal cues;Handout    Comprehension Verbalized  understanding;Returned demonstration            PT Short Term Goals - 08/05/20 1603      PT SHORT TERM GOAL #1   Title Pt will be Ind in an Initial HEP    Baseline Started day of eval    Time 3    Period Weeks    Status New    Target Date 08/26/20      PT SHORT TERM GOAL #2   Title Pt will voice understanding of symptom management, ie ice pack, massage, proper footwear    Time 3    Period Weeks    Target Date 08/26/20             PT Long Term Goals - 08/05/20 1605      PT LONG TERM GOAL #1   Title Pt will be Ind in a final HEP    Time 7    Period Weeks    Status New    Target Date 09/23/20      PT LONG TERM GOAL #2   Title Pt will  report being able to walk a mile c moderate difficulty    Baseline Extreme difficulty    Time 7    Period Weeks    Status New    Target Date 09/23/20      PT LONG TERM GOAL #3   Title Pt will report tolerating daily activities with a little bit of difficulty    Baseline Moderate difficulty    Time 7    Period Weeks    Status New    Target Date 09/23/20      PT LONG TERM GOAL #4   Title FOTO score will improve to 39% Limitation    Baseline 53% Limitation    Time 7    Period Weeks    Status New    Target Date 09/23/20      PT LONG TERM GOAL #5   Title pt will report a decrease in L ankle/achilles pain level to 4/10 or less following a day of work    Baseline 8-10/10    Time 7    Period Weeks    Status New    Target Date 09/23/20                 Plan - 08/11/20 1446    Clinical Impression Statement Decreased heel cord pain sith new Brooks shoes. More pain dorsum of the LT foot. Able to demo gastroc and soleus stretch HEP    PT Treatment/Interventions ADLs/Self Care Home Management;Electrical Stimulation;Iontophoresis 4mg /ml Dexamethasone;Moist Heat;Ultrasound;Gait training;Stair training;Functional mobility training;Therapeutic activities;Therapeutic exercise;Manual techniques;Passive range of motion;Dry  needling;Taping    PT Next Visit Plan eccentric HEP  assess  [place] strengthening ankle . foot , modalities as needed    PT Home Exercise Plan [id number]: Gastroc and soleus stretches, eccentric exercise from floor knee bent ,knee straight    Consulted and Agree with Plan of Care Patient           Patient will benefit from skilled therapeutic intervention in order to improve the following deficits and impairments:  Abnormal gait, Decreased range of motion, Difficulty walking, Obesity, Decreased activity tolerance, Pain, Decreased strength  Visit Diagnosis: Pain in left ankle and joints of left foot  Achilles tendonosis of left lower extremity  Decreased range of motion of left ankle  Other abnormalities of gait and mobility     Problem List Patient Active Problem List   Diagnosis Date Noted  . PID (acute pelvic inflammatory disease) 08/27/2016  . Sepsis, unspecified organism ([place]) 08/27/2016  . Abnormal uterine bleeding (AUB) 08/27/2016  . Microcytic anemia 08/27/2016  . Sepsis ([place]) 08/27/2016    Darrel Hoover  PT 08/11/2020, 3:49 PM  [facility] [address] [place], [place], [address] Phone: [phone]   Fax:  [phone]  Name: Mistie Adney MRN: [id number] Date of Birth: 23-Apr-1966

## 2020-08-18 ENCOUNTER — Other Ambulatory Visit: Payer: Self-pay

## 2020-08-18 ENCOUNTER — Ambulatory Visit: Payer: Managed Care, Other (non HMO) | Attending: Orthopedic Surgery

## 2020-08-18 DIAGNOSIS — R2689 Other abnormalities of gait and mobility: Secondary | ICD-10-CM | POA: Diagnosis present

## 2020-08-18 DIAGNOSIS — M25572 Pain in left ankle and joints of left foot: Secondary | ICD-10-CM | POA: Insufficient documentation

## 2020-08-18 DIAGNOSIS — M25672 Stiffness of left ankle, not elsewhere classified: Secondary | ICD-10-CM | POA: Insufficient documentation

## 2020-08-18 DIAGNOSIS — M6788 Other specified disorders of synovium and tendon, other site: Secondary | ICD-10-CM | POA: Insufficient documentation

## 2020-08-18 NOTE — Therapy (Addendum)
[place], [place], [address] Phone: [phone]   Fax:  [phone]  Physical Therapy Treatment / Discharge  Patient Details  Name: Sharon Manning MRN: [id number] Date of Birth: Jun 17, 1966 Referring Provider (PT): Daws, Janetta Hora, MD   Encounter Date: 08/18/2020   PT End of Session - 08/18/20 1553    Visit Number 3    Number of Visits 13    Date for PT Re-Evaluation 09/25/20    Authorization Type [employer] MANAGED    Progress Note Due on Visit 10    PT Start Time 1453    PT Stop Time 1533    PT Time Calculation (min) 40 min    Activity Tolerance Patient tolerated treatment well    Behavior During Therapy [facility] for tasks assessed/performed           Past Medical History:  Diagnosis Date  . Hypertension     History reviewed. No pertinent surgical history.  There were no vitals filed for this visit.   Subjective Assessment - 08/18/20 1548    Subjective Pt reports being pleased with her new shoes which have helped to resolve her achilles pain. Pt still reports lateral ankle pain with walking at work. Pt she did not work today and she is not having any lat. L ankle pain.    Patient Stated Goals To have less pain and tolerate my job better    Currently in Pain? No/denies    Pain Score 6    at the end of the work day   Pain Location Ankle    Pain Orientation Left;Posterior;Lateral    Pain Descriptors / Indicators Aching    Pain Type Chronic pain    Pain Onset --   6 mths ago   Pain Frequency Intermittent    Aggravating Factors  walking, standing    Pain Relieving Factors rest    Effect of Pain on Daily Activities pushes through th pain at work                             [facility] Adult PT Treatment/Exercise - 08/18/20 0001      Ambulation/Gait   Gait Pattern Step-through pattern   L ankle/foot- heel/toe gait pattern     Exercises   Exercises Ankle      Ankle Exercises: Stretches    Plantar Fascia Stretch 2 reps;30 seconds    Soleus Stretch 2 reps;20 seconds      Ankle Exercises: Seated   Other Seated Ankle Exercises Ankle 4 way c RTB 10x each direction    Other Seated Ankle Exercises SLS L LE c 4 way vectors 30 sec; 3x                    PT Short Term Goals - 08/05/20 1603      PT SHORT TERM GOAL #1   Title Pt will be Ind in an Initial HEP    Baseline Started day of eval    Time 3    Period Weeks    Status New    Target Date 08/26/20      PT SHORT TERM GOAL #2   Title Pt will voice understanding of symptom management, ie ice pack, massage, proper footwear    Time 3    Period Weeks    Target Date 08/26/20             PT Long Term  Goals - 08/05/20 1605      PT LONG TERM GOAL #1   Title Pt will be Ind in a final HEP    Time 7    Period Weeks    Status New    Target Date 09/23/20      PT LONG TERM GOAL #2   Title Pt will report being able to walk a mile c moderate difficulty    Baseline Extreme difficulty    Time 7    Period Weeks    Status New    Target Date 09/23/20      PT LONG TERM GOAL #3   Title Pt will report tolerating daily activities with a little bit of difficulty    Baseline Moderate difficulty    Time 7    Period Weeks    Status New    Target Date 09/23/20      PT LONG TERM GOAL #4   Title FOTO score will improve to 39% Limitation    Baseline 53% Limitation    Time 7    Period Weeks    Status New    Target Date 09/23/20      PT LONG TERM GOAL #5   Title pt will report a decrease in L ankle/achilles pain level to 4/10 or less following a day of work    Baseline 8-10/10    Time 7    Period Weeks    Status New    Target Date 09/23/20                 Plan - 08/18/20 1555    Clinical Impression Statement Achilles pain is much better with pt not having pain associated in this area. Pt doe report posterior/lateral L malleolus pain. Pt did not work today and is not having any L ankle pain. Pt's pain  was not able to be reproduced c active, passisve or resisted movements. Ankle strengthening/stability exs were started. Obsevation of gait found the ankle/foot to have a normalized heel/toe pattern.    Personal Factors and Comorbidities Past/Current Experience;Profession;Comorbidity 1    Comorbidities Obese    Examination-Activity Limitations Locomotion Level    Stability/Clinical Decision Making Stable/Uncomplicated    Clinical Decision Making Low    Rehab Potential Good    PT Frequency 2x / week    PT Duration 6 weeks    PT Treatment/Interventions ADLs/Self Care Home Management;Electrical Stimulation;Iontophoresis 4mg /ml Dexamethasone;Moist Heat;Ultrasound;Gait training;Stair training;Functional mobility training;Therapeutic activities;Therapeutic exercise;Manual techniques;Passive range of motion;Dry needling;Taping    PT Next Visit Plan L ankle strengthening and stability exs    PT Home Exercise Plan [id number]: Gastroc and soleus stretches, 4 way ankle strengthening c RTB, L SLS c vectors    Consulted and Agree with Plan of Care Patient           Patient will benefit from skilled therapeutic intervention in order to improve the following deficits and impairments:  Abnormal gait, Decreased range of motion, Difficulty walking, Obesity, Decreased activity tolerance, Pain, Decreased strength  Visit Diagnosis: Pain in left ankle and joints of left foot  Achilles tendonosis of left lower extremity  Decreased range of motion of left ankle  Other abnormalities of gait and mobility     Problem List Patient Active Problem List   Diagnosis Date Noted  . PID (acute pelvic inflammatory disease) 08/27/2016  . Sepsis, unspecified organism ([place]) 08/27/2016  . Abnormal uterine bleeding (AUB) 08/27/2016  . Microcytic anemia 08/27/2016  . Sepsis ([place]) 08/27/2016  Gar Ponto MS, PT 08/18/20 4:19 PM   [facility] [address] [place], [place], [address] Phone: [phone]   Fax:  [phone]  Name: Alexa Blish MRN: [id number] Date of Birth: January 20, 1966      PHYSICAL THERAPY DISCHARGE SUMMARY  Visits from Start of Care: 3  Current functional level related to goals / functional outcomes: See goals   Remaining deficits: Current status unknown   Education / Equipment: HEP  Plan: Patient agrees to discharge.  Patient goals were not met. Patient is being discharged due to not returning since the last visit.  ?????        Kristoffer Leamon PT, DPT, LAT, ATC  02/04/21  9:15 AM

## 2020-08-21 ENCOUNTER — Emergency Department (HOSPITAL_BASED_OUTPATIENT_CLINIC_OR_DEPARTMENT_OTHER): Payer: Managed Care, Other (non HMO)

## 2020-08-21 ENCOUNTER — Other Ambulatory Visit: Payer: Self-pay

## 2020-08-21 ENCOUNTER — Encounter (HOSPITAL_COMMUNITY): Payer: Self-pay

## 2020-08-21 ENCOUNTER — Emergency Department (HOSPITAL_COMMUNITY)
Admission: EM | Admit: 2020-08-21 | Discharge: 2020-08-21 | Disposition: A | Discharge status: Home / Self Care | Payer: Managed Care, Other (non HMO) | Attending: Emergency Medicine | Admitting: Emergency Medicine

## 2020-08-21 DIAGNOSIS — M79605 Pain in left leg: Secondary | ICD-10-CM

## 2020-08-21 DIAGNOSIS — R238 Other skin changes: Secondary | ICD-10-CM

## 2020-08-21 DIAGNOSIS — M79609 Pain in unspecified limb: Secondary | ICD-10-CM

## 2020-08-21 DIAGNOSIS — M79662 Pain in left lower leg: Secondary | ICD-10-CM | POA: Diagnosis present

## 2020-08-21 MED ORDER — CEPHALEXIN 500 MG PO CAPS: 500.0000 mg | ORAL_CAPSULE | Freq: Four times a day (QID) | ORAL | 0 refills | Status: AC

## 2020-08-21 NOTE — ED Triage Notes (Addendum)
Emergency Medicine Provider Triage Evaluation Note  Sharon Manning , a 54 y.o. female  was evaluated in triage.  Pt complains of left lower extremity pain since yesterday.  Also noticed some erythema involving the leg went to her PCP for further evaluation who sent her here for DVT study.  Denies recent travel or surgeries, leg swelling, hemoptysis, chest pain or shortness of breath.  No prior history of DVT or PE.  No known trauma.  Review of Systems  Positive: Leg pain Negative: Numbness, weakness, fevers, leg swelling  Physical Exam  BP (!) 148/112   Pulse 90   Temp 98.6 F (37 C) (Oral)   Resp 16   Ht 5\' 3"  (1.6 m)   Wt 108.4 kg   SpO2 99%   BMI 42.34 kg/m  Gen:   Awake, no distress   HEENT:  Atraumatic  Resp:  Normal effort  Cardiac:  Normal rate, 2+ radial and DP/PT pulses bilaterally, Homans' sign present on the left, compartments are soft Abd:   Nondistended, nontender  MSK:   Moves extremities without difficulty  Neuro:  Speech clear  Skin:  Mild erythema involving the anterior medial aspect of the left lower extremity, no induration.  Medical Decision Making  Medically screening exam initiated at 5:05 PM.  Appropriate orders placed.  Twila Rappa was informed that the remainder of the evaluation will be completed by another provider, this initial triage assessment does not replace that evaluation, and the importance of remaining in the ED until their evaluation is complete.  Clinical Impression   Sent here for rule out of DVT.  Will order DVT study.  She is neurovascularly intact and compartments are soft.  Low suspicion of osteomyelitis or necrotizing fasciitis at this time.   Renita Papa, PA-C 08/21/20 1706  7:12 PM Addendum: DVT studies negative.  Patient resting comfortably on reevaluation.  Vital signs remain reassuring.  Compartments soft and she remains neurovascularly intact.  With some erythema involving the anterior medial aspect of the left lower  extremity, will cover with Keflex for treatment of cellulitis.  Recommend anti-inflammatories, Tylenol, elevation, ice/heat therapy.  She will follow-up with her PCP for reevaluation of symptoms and possible repeat DVT study for confirmation within 1 week.  Discussed strict ED return precautions.  Patient verbalized understanding of and agreement with plan and is stable for discharge at this time.   Renita Papa, PA-C 08/21/20 1913

## 2020-08-21 NOTE — ED Triage Notes (Signed)
Pt arrives to ED w/ c/o LLE pain. Pt sent by PCP for r/o dvt.

## 2020-08-21 NOTE — Progress Notes (Signed)
Lower extremity venous LT study completed  Preliminary results relayed to [place], [place].   See CV Proc for preliminary results report.   Sharon Manning

## 2020-08-21 NOTE — Discharge Instructions (Signed)
Please take all of your antibiotics until finished!   Take your antibiotics with food.  Common side effects of antibiotics include nausea, vomiting, abdominal discomfort, and diarrhea. You may help offset some of this with probiotics which you can buy or get in yogurt. Do not eat  or take the probiotics until 2 hours after your antibiotic.    Some studies suggest that certain antibiotics can reduce the efficacy of certain oral contraceptive pills (birth control), so please use additional contraceptives (condoms or other barrier method) while you are taking the antibiotics and for an additional 5 to 7 days afterwards if you are a female on these medications.  Your DVT study today was negative.  We are covering you with antibiotics for the possibility of a skin infection.  You can mark the area of redness with a skin pen to monitor for spreading.  You can alternate 600 mg of ibuprofen and two extra strength Tylenol every 3 hours, which means that you get each medicine every 6 hours.  Make sure to take ibuprofen with food to avoid upset stomach issues.  Do not exceed more than 4000 mg of Tylenol daily.  You can wear compression stockings throughout the day.  Elevate the extremities when you are not walking on them and you can apply ice or heat, whichever feels best 20 minutes at a time a few times daily for the next few days.  Follow-up with your PCP for reevaluation of your symptoms.  Return to the emergency department if any concerning signs or symptoms develop such as fevers, severe swelling, loss of pulses, weakness, shortness of breath or chest pain.

## 2020-08-21 NOTE — ED Notes (Signed)
Patient verbalizes understanding of discharge instructions. Opportunity for questioning and answers were provided. Armband removed by staff, pt discharged from ED ambulatory.   

## 2020-08-22 ENCOUNTER — Ambulatory Visit: Payer: Managed Care, Other (non HMO)

## 2020-08-29 ENCOUNTER — Ambulatory Visit: Payer: Managed Care, Other (non HMO)

## 2020-09-01 ENCOUNTER — Ambulatory Visit: Payer: Managed Care, Other (non HMO)

## 2020-09-05 ENCOUNTER — Ambulatory Visit: Payer: Managed Care, Other (non HMO)
